# Patient Record
Sex: Male | Born: 1975 | Race: White | Hispanic: No | Marital: Single | Smoking: Former smoker
Health system: Southern US, Community
[De-identification: ages and names within clinical notes are randomized; demographics above are authoritative.]

## PROBLEM LIST (undated history)

## (undated) DIAGNOSIS — B1920 Unspecified viral hepatitis C without hepatic coma: Secondary | ICD-10-CM

## (undated) DIAGNOSIS — F329 Major depressive disorder, single episode, unspecified: Secondary | ICD-10-CM

## (undated) DIAGNOSIS — I509 Heart failure, unspecified: Secondary | ICD-10-CM

## (undated) DIAGNOSIS — F1920 Other psychoactive substance dependence, uncomplicated: Secondary | ICD-10-CM

## (undated) DIAGNOSIS — F32A Depression, unspecified: Secondary | ICD-10-CM

## (undated) DIAGNOSIS — I10 Essential (primary) hypertension: Secondary | ICD-10-CM

## (undated) DIAGNOSIS — E039 Hypothyroidism, unspecified: Secondary | ICD-10-CM

## (undated) DIAGNOSIS — F419 Anxiety disorder, unspecified: Secondary | ICD-10-CM

## (undated) HISTORY — DX: Heart failure, unspecified: I50.9

## (undated) HISTORY — DX: Depression, unspecified: F32.A

## (undated) HISTORY — DX: Other psychoactive substance dependence, uncomplicated: F19.20

## (undated) HISTORY — DX: Unspecified viral hepatitis C without hepatic coma: B19.20

## (undated) HISTORY — PX: OTHER SURGICAL HISTORY: SHX169

## (undated) HISTORY — DX: Essential (primary) hypertension: I10

## (undated) HISTORY — DX: Hypothyroidism, unspecified: E03.9

## (undated) HISTORY — DX: Major depressive disorder, single episode, unspecified: F32.9

## (undated) HISTORY — DX: Anxiety disorder, unspecified: F41.9

---

## 2001-11-20 DIAGNOSIS — B1920 Unspecified viral hepatitis C without hepatic coma: Secondary | ICD-10-CM

## 2001-11-20 HISTORY — DX: Unspecified viral hepatitis C without hepatic coma: B19.20

## 2002-12-12 ENCOUNTER — Encounter: Payer: Self-pay | Admitting: Emergency Medicine

## 2002-12-12 ENCOUNTER — Emergency Department (HOSPITAL_COMMUNITY): Admission: EM | Admit: 2002-12-12 | Discharge: 2002-12-13 | Payer: Self-pay | Admitting: Emergency Medicine

## 2004-02-05 ENCOUNTER — Emergency Department (HOSPITAL_COMMUNITY): Admission: EM | Admit: 2004-02-05 | Discharge: 2004-02-05 | Payer: Self-pay | Admitting: Emergency Medicine

## 2010-03-10 ENCOUNTER — Ambulatory Visit (HOSPITAL_COMMUNITY): Admission: RE | Admit: 2010-03-10 | Discharge: 2010-03-10 | Payer: Self-pay | Admitting: Internal Medicine

## 2010-12-11 ENCOUNTER — Encounter (INDEPENDENT_AMBULATORY_CARE_PROVIDER_SITE_OTHER): Payer: Self-pay | Admitting: Internal Medicine

## 2011-02-07 LAB — CBC
HCT: 48.3 % (ref 39.0–52.0)
Hemoglobin: 16.6 g/dL (ref 13.0–17.0)
MCHC: 34.3 g/dL (ref 30.0–36.0)
MCV: 93.9 fL (ref 78.0–100.0)
Platelets: 247 10*3/uL (ref 150–400)
RBC: 5.14 MIL/uL (ref 4.22–5.81)
RDW: 12.7 % (ref 11.5–15.5)
WBC: 5.9 10*3/uL (ref 4.0–10.5)

## 2011-02-07 LAB — PROTIME-INR
INR: 0.96 (ref 0.00–1.49)
Prothrombin Time: 12.7 seconds (ref 11.6–15.2)

## 2011-02-07 LAB — APTT: aPTT: 33 seconds (ref 24–37)

## 2011-10-06 IMAGING — US US BIOPSY
1 series · 13 of 13 positions shown · non-contrast
Comparison: none

Clinical: Hepatitis C

ULTRASOUND-GUIDED RANDOM CORE LIVER BIOPSY:
An ultrasound guided liver biopsy was thoroughly discussed with the
patient and questions were answered. The benefits, risks,
alternatives, and complications were also discussed. The patient
understands and wishes to proceed with the procedure. A verbal as
well as written consent was obtained.
Ultrasound imaging of the liver was performed and an appropriate
skin entry site was determined. Skin site was marked, prepped and
draped in the usual sterile fashion. 1% Lidocaine was infiltrated
locally. A 17 gauge Trocar needle was advanced under ultrasound
guidance into the liver. Imaging was obtained documenting
appropriate needle position.  Three coaxial 18 gauge core samples
were then obtained and sent to the laboratory for further analysis.
Post procedure scans demonstrate no evidence of bleeding or
hematoma.  The patient tolerated the procedure well with no
immediate complication.
Cardiac and respiratory monitoring was provided by the radiology
RN.  Moderate sedation in the form of 200 mcg fentanyl and 4 mg of
versed were administered throughout the procedure for a total of 20
minutes.

[Series 1: us biopsy · 0.24mm/px · 13 of 13 slices shown]
[im 1/13]
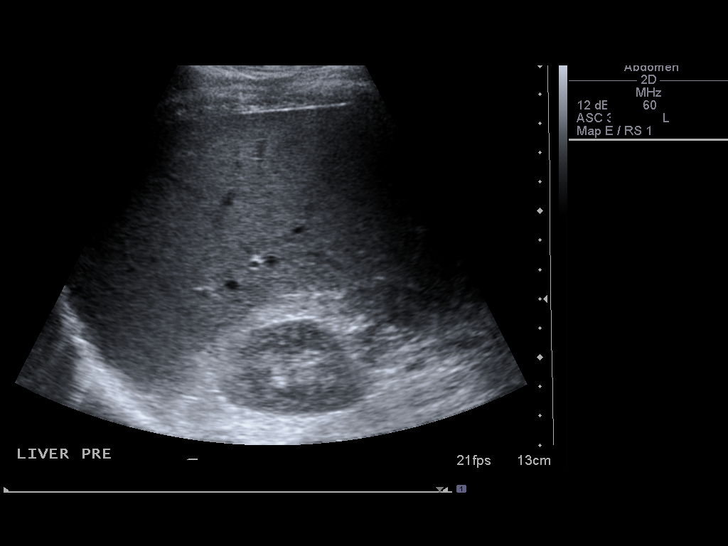
[im 2/13]
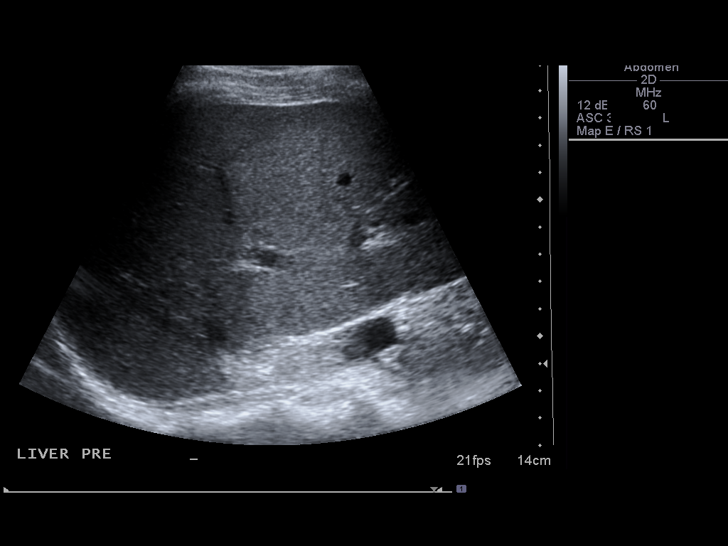
[im 3/13]
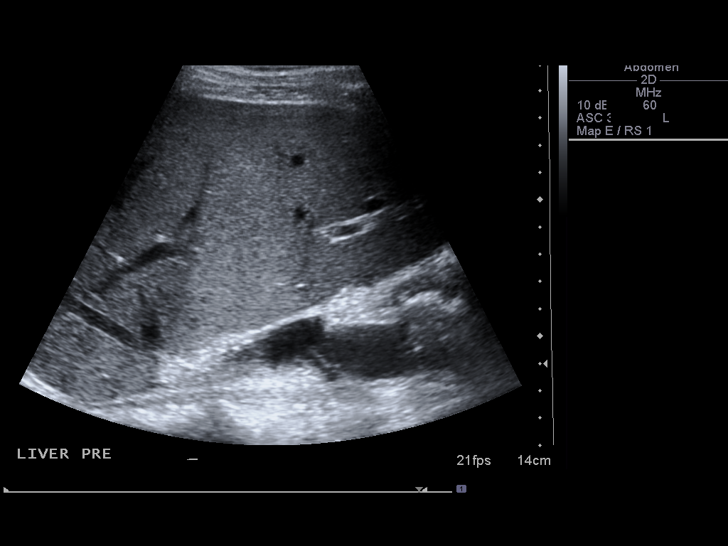
[im 4/13]
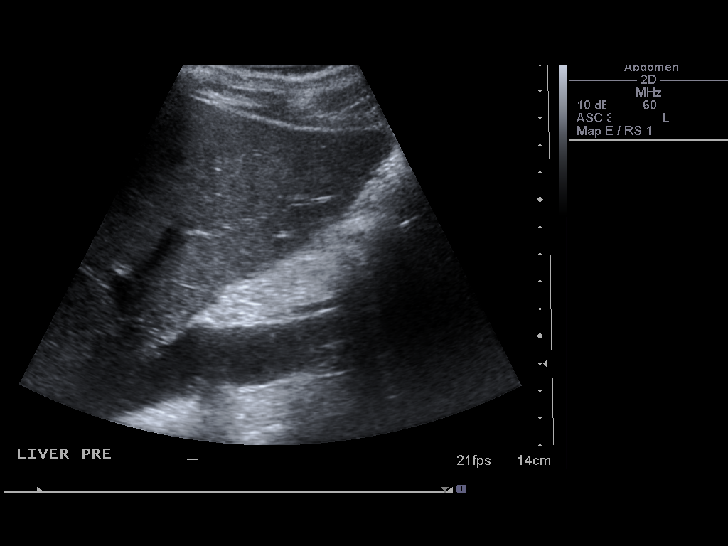
[im 5/13]
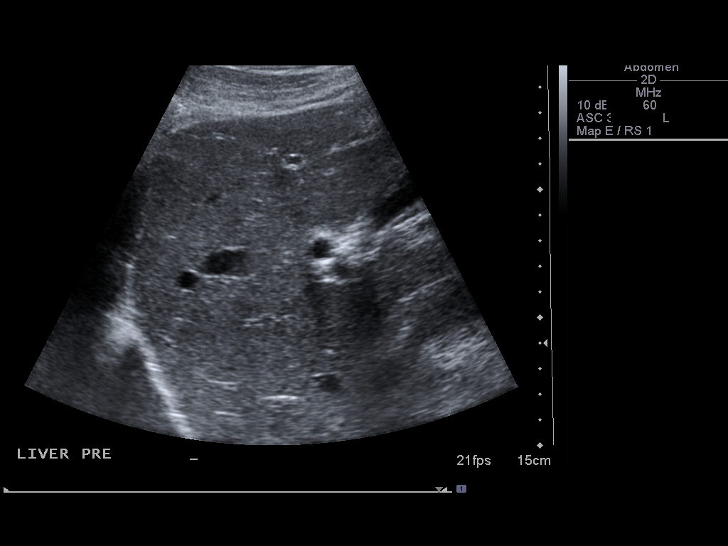
[im 6/13]
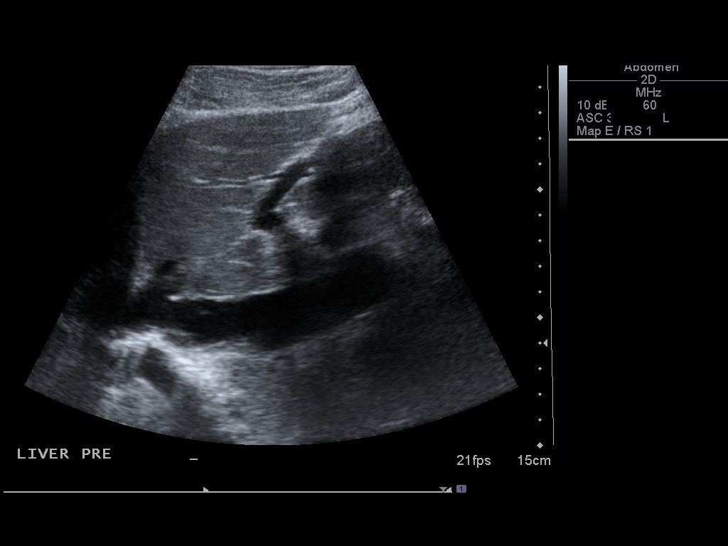
[im 7/13]
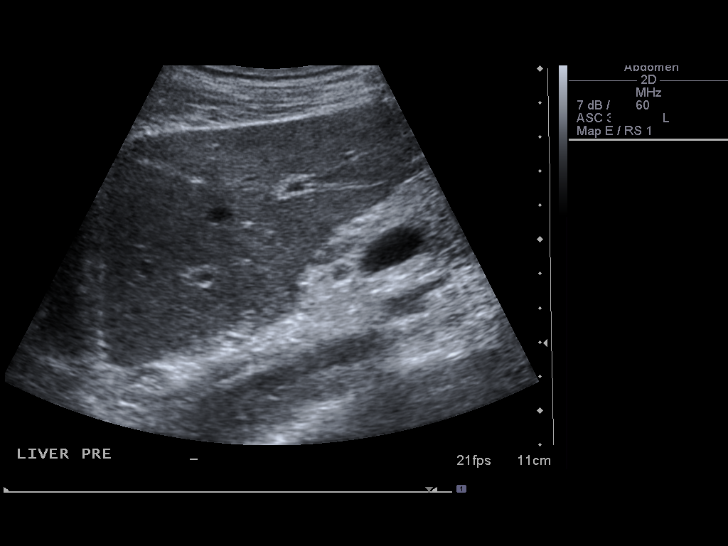
[im 8/13]
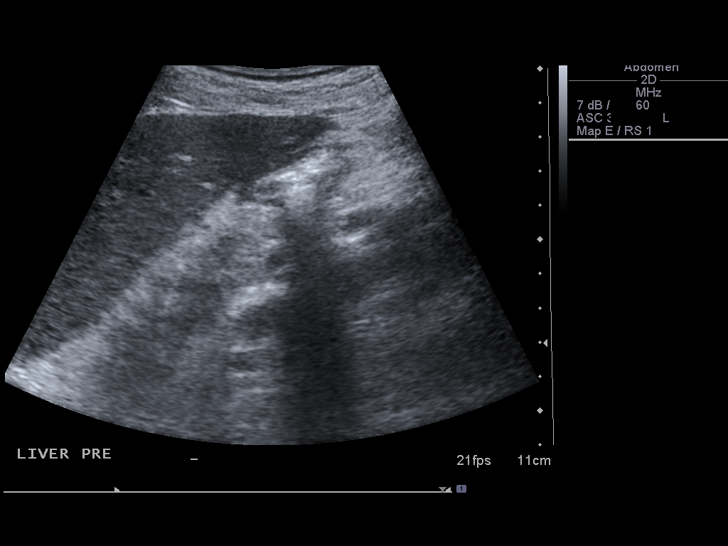
[im 9/13]
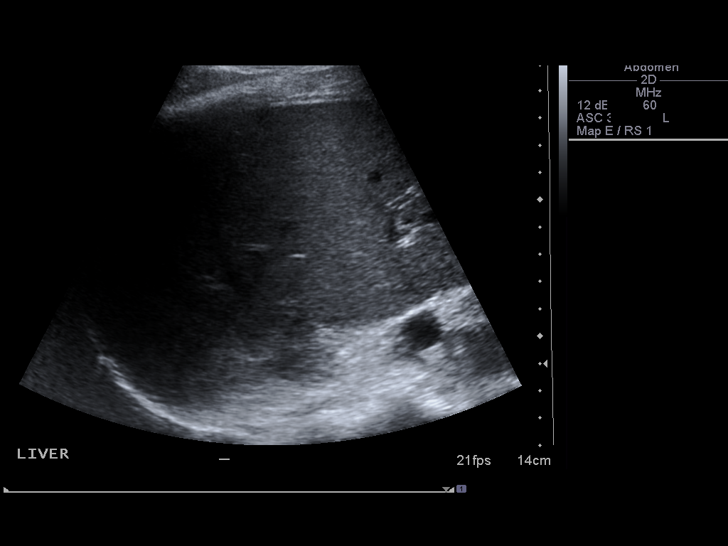
[im 10/13]
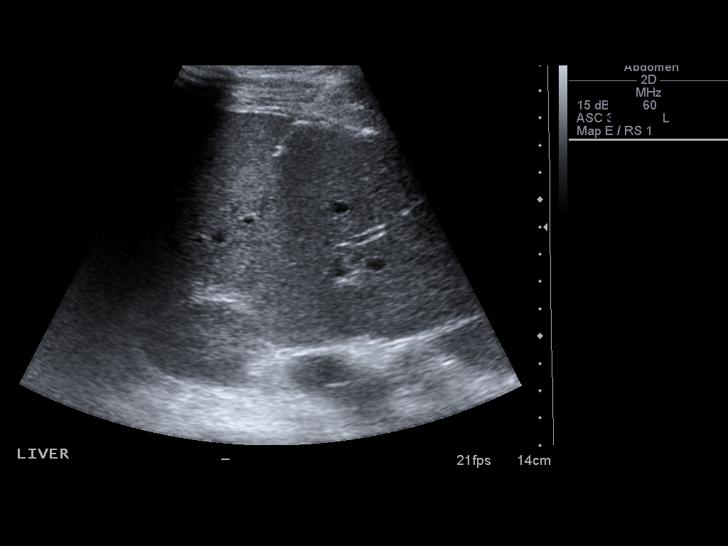
[im 11/13]
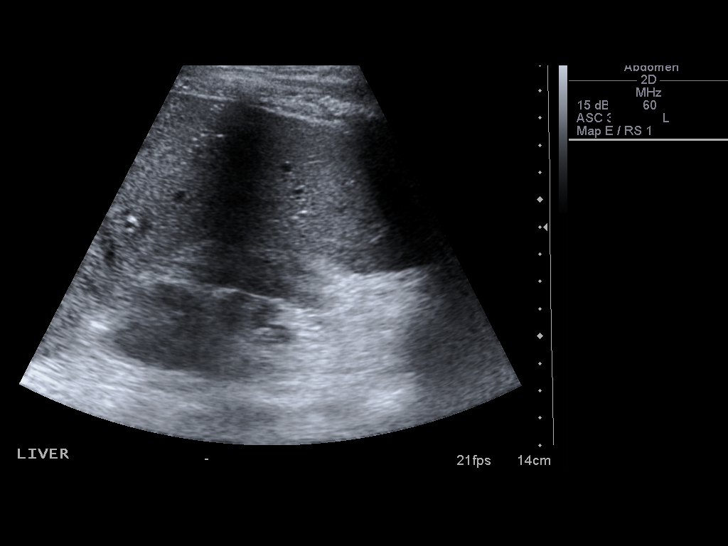
[im 12/13]
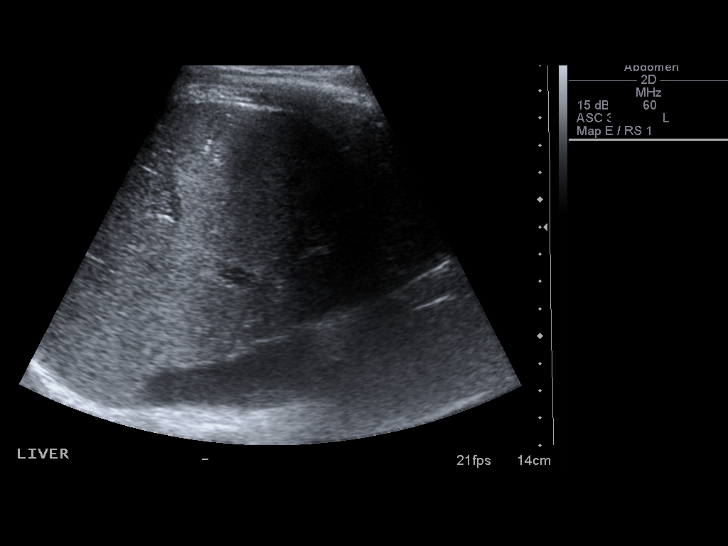
[im 13/13]
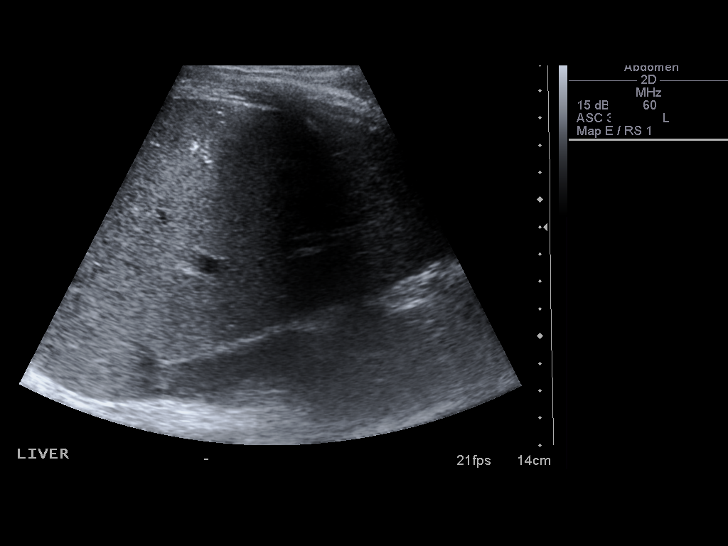

[13 of 13 positions shown; findings below may reference images not displayed]

IMPRESSION: 1. Technically successful ultrasound guided random liver core
biopsy with moderate sedation as described above.

Read by: Seoane, Cervantes.-TIGER

## 2013-12-09 ENCOUNTER — Encounter (INDEPENDENT_AMBULATORY_CARE_PROVIDER_SITE_OTHER): Payer: Self-pay | Admitting: *Deleted

## 2013-12-23 ENCOUNTER — Ambulatory Visit (INDEPENDENT_AMBULATORY_CARE_PROVIDER_SITE_OTHER): Payer: Private Health Insurance - Indemnity | Admitting: Internal Medicine

## 2013-12-23 ENCOUNTER — Encounter (INDEPENDENT_AMBULATORY_CARE_PROVIDER_SITE_OTHER): Payer: Self-pay | Admitting: Internal Medicine

## 2013-12-23 VITALS — BP 112/60 | HR 72 | Temp 98.1°F | Ht 73.0 in | Wt 168.6 lb

## 2013-12-23 DIAGNOSIS — F1920 Other psychoactive substance dependence, uncomplicated: Secondary | ICD-10-CM

## 2013-12-23 DIAGNOSIS — R768 Other specified abnormal immunological findings in serum: Secondary | ICD-10-CM

## 2013-12-23 DIAGNOSIS — R894 Abnormal immunological findings in specimens from other organs, systems and tissues: Secondary | ICD-10-CM

## 2013-12-23 DIAGNOSIS — B1920 Unspecified viral hepatitis C without hepatic coma: Secondary | ICD-10-CM | POA: Insufficient documentation

## 2013-12-23 NOTE — Progress Notes (Addendum)
Subjective:     Patient ID: Donald George, male   DOB: 1976-11-17, 38 y.o.   MRN: [id number]  HPI  Here today for f/u of his Hepatitis C. He was actually seen in the [place] office for this.  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 He was diagnosed with Hepatitis C about 10 yrs ago. He has never received treatment.  He has not done IV drugs for about a year. No etoh in one year ago.  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 He has been in rehab several times. Last stint for rehab was in [place] 1 yr ago.  He tells me this Saturday, he ran out of Soboxone. He snorted Heroin and cocaine this weekend.  When he runs out of this drug, he will do these drugs + Xanax.  Appetite is good. ? Weight loss.  Has a BM daily.  He has received the Hepatitis A and B.   03/10/2010 Liver Biopsy: 1. LIVER, NEEDLE/CORE BIOPSY, RANDOM : CHRONIC ACTIVE HEPATITIS CONSISTENT WITH HEPATITIS C CHARACTERIZED BY, Mild activity, grade ii, No increased fibrosis, stage 0.    Review of Systems Current Outpatient Prescriptions  Medication Sig Dispense Refill  . buprenorphine-naloxone (SUBOXONE) 8-2 MG SUBL SL tablet Place 1 tablet under the tongue. Every 2-3 days      . FLUoxetine (PROZAC) 10 MG capsule Take 10 mg by mouth daily.      . traMADol (ULTRAM) 50 MG tablet Take by mouth every 6 (six) hours as needed.       No current facility-administered medications for this visit.   Past Medical History  Diagnosis Date  . Hepatitis C   . Drug addiction     since age 38 to opiates and etoh. Was an IV drug user.        Objective:   Physical Exam  Filed Vitals:   12/23/13 1504  BP: 112/60  Pulse: 72  Temp: 98.1 F (36.7 C)  Height: 6\' 1"  (1.854 m)  Weight: 168 lb 9.6 oz (76.476 kg)  Alert and oriented. Skin warm and dry. Oral mucosa is moist.   . Sclera anicteric, conjunctivae is pink. Thyroid not enlarged. No cervical lymphadenopathy. Lungs clear. Heart regular rate and rhythm.  Abdomen  is soft. Bowel sounds are positive. No hepatomegaly. No abdominal masses felt. No tenderness.  No edema to lower extremities.       Assessment:   Hepatitis C. Active. Patient is seeking treatment for his Hepatitis C. Presently taking Suboxone which  he gets off the street.  Admits to snorting cocaine and heroin this past weekend. He was having withdrawal symptoms.    Plan:    TSH, Hep C Quaint,   CBC, Cmet, PT/INR, Hepatitis C genotype. Urine Drug screen.  AFP. Patient must be off drugs for 6 months before we treat.  I discussed this case with Dr. Karilyn Cotaehman.

## 2013-12-23 NOTE — Patient Instructions (Signed)
Labs for Hepatitis C. OV in 1 month.

## 2013-12-24 LAB — DRUG SCREEN, URINE
AMPHETAMINE SCRN UR: NEGATIVE
BARBITURATE QUANT UR: NEGATIVE
Benzodiazepines.: NEGATIVE
COCAINE METABOLITES: NEGATIVE
Creatinine,U: 32.4 mg/dL
METHADONE: NEGATIVE
Marijuana Metabolite: NEGATIVE
Opiates: NEGATIVE
Phencyclidine (PCP): NEGATIVE
Propoxyphene: NEGATIVE

## 2013-12-24 LAB — COMPREHENSIVE METABOLIC PANEL
ALBUMIN: 4.8 g/dL (ref 3.5–5.2)
ALT: 67 U/L — AB (ref 0–53)
AST: 51 U/L — ABNORMAL HIGH (ref 0–37)
Alkaline Phosphatase: 58 U/L (ref 39–117)
BUN: 12 mg/dL (ref 6–23)
CALCIUM: 9.4 mg/dL (ref 8.4–10.5)
CHLORIDE: 102 meq/L (ref 96–112)
CO2: 30 meq/L (ref 19–32)
Creat: 0.77 mg/dL (ref 0.50–1.35)
GLUCOSE: 91 mg/dL (ref 70–99)
POTASSIUM: 4.1 meq/L (ref 3.5–5.3)
SODIUM: 139 meq/L (ref 135–145)
TOTAL PROTEIN: 7.4 g/dL (ref 6.0–8.3)
Total Bilirubin: 0.4 mg/dL (ref 0.2–1.2)

## 2013-12-24 LAB — CBC WITH DIFFERENTIAL/PLATELET
Basophils Absolute: 0 10*3/uL (ref 0.0–0.1)
Basophils Relative: 0 % (ref 0–1)
EOS ABS: 0.2 10*3/uL (ref 0.0–0.7)
Eosinophils Relative: 3 % (ref 0–5)
HCT: 44.1 % (ref 39.0–52.0)
HEMOGLOBIN: 15 g/dL (ref 13.0–17.0)
LYMPHS ABS: 1.8 10*3/uL (ref 0.7–4.0)
LYMPHS PCT: 28 % (ref 12–46)
MCH: 29.6 pg (ref 26.0–34.0)
MCHC: 34 g/dL (ref 30.0–36.0)
MCV: 87.2 fL (ref 78.0–100.0)
MONOS PCT: 5 % (ref 3–12)
Monocytes Absolute: 0.3 10*3/uL (ref 0.1–1.0)
NEUTROS ABS: 3.9 10*3/uL (ref 1.7–7.7)
NEUTROS PCT: 64 % (ref 43–77)
Platelets: 261 10*3/uL (ref 150–400)
RBC: 5.06 MIL/uL (ref 4.22–5.81)
RDW: 13.5 % (ref 11.5–15.5)
WBC: 6.2 10*3/uL (ref 4.0–10.5)

## 2013-12-24 LAB — PROTIME-INR
INR: 1 (ref <1.50)
PROTHROMBIN TIME: 13.1 s (ref 11.6–15.2)

## 2013-12-24 LAB — AFP TUMOR MARKER: AFP TUMOR MARKER: 2.8 ng/mL (ref 0.0–8.0)

## 2013-12-24 LAB — TSH: TSH: 5.191 u[IU]/mL — AB (ref 0.350–4.500)

## 2013-12-26 LAB — HEPATITIS C RNA QUANTITATIVE
HCV Quantitative Log: 7.05 {Log} — ABNORMAL HIGH (ref <1.18)
HCV Quantitative: 11124118 IU/mL — ABNORMAL HIGH (ref <15)

## 2013-12-29 ENCOUNTER — Other Ambulatory Visit (HOSPITAL_COMMUNITY): Payer: Private Health Insurance - Indemnity

## 2013-12-29 LAB — HEPATITIS C GENOTYPE

## 2013-12-30 ENCOUNTER — Ambulatory Visit (HOSPITAL_COMMUNITY): Payer: Managed Care, Other (non HMO)

## 2014-01-16 ENCOUNTER — Ambulatory Visit (HOSPITAL_COMMUNITY)
Admission: RE | Admit: 2014-01-16 | Discharge: 2014-01-16 | Disposition: A | Discharge status: Home / Self Care | Payer: Managed Care, Other (non HMO) | Source: Ambulatory Visit | Attending: Internal Medicine | Admitting: Internal Medicine

## 2014-01-16 DIAGNOSIS — B1920 Unspecified viral hepatitis C without hepatic coma: Secondary | ICD-10-CM | POA: Insufficient documentation

## 2014-01-16 DIAGNOSIS — R768 Other specified abnormal immunological findings in serum: Secondary | ICD-10-CM

## 2014-01-20 NOTE — Progress Notes (Signed)
Apt has been scheduled for 07/24/14 with Dorene Arerri Setzer, NP.

## 2014-01-23 ENCOUNTER — Ambulatory Visit (INDEPENDENT_AMBULATORY_CARE_PROVIDER_SITE_OTHER): Payer: Managed Care, Other (non HMO) | Admitting: Internal Medicine

## 2014-07-24 ENCOUNTER — Encounter (INDEPENDENT_AMBULATORY_CARE_PROVIDER_SITE_OTHER): Payer: Self-pay | Admitting: *Deleted

## 2014-07-24 ENCOUNTER — Telehealth (INDEPENDENT_AMBULATORY_CARE_PROVIDER_SITE_OTHER): Payer: Self-pay | Admitting: *Deleted

## 2014-07-24 ENCOUNTER — Ambulatory Visit (INDEPENDENT_AMBULATORY_CARE_PROVIDER_SITE_OTHER): Payer: Managed Care, Other (non HMO) | Admitting: Internal Medicine

## 2014-07-24 NOTE — Telephone Encounter (Signed)
Bradey No Showed for his apt with Dorene Ar, NP on 07/24/14. A NS letter was mail.

## 2014-08-28 ENCOUNTER — Ambulatory Visit (INDEPENDENT_AMBULATORY_CARE_PROVIDER_SITE_OTHER): Payer: Managed Care, Other (non HMO) | Admitting: Internal Medicine

## 2014-10-30 ENCOUNTER — Encounter (INDEPENDENT_AMBULATORY_CARE_PROVIDER_SITE_OTHER): Payer: Self-pay | Admitting: *Deleted

## 2014-10-30 ENCOUNTER — Ambulatory Visit (INDEPENDENT_AMBULATORY_CARE_PROVIDER_SITE_OTHER): Payer: Managed Care, Other (non HMO) | Admitting: Internal Medicine

## 2014-10-30 ENCOUNTER — Encounter (INDEPENDENT_AMBULATORY_CARE_PROVIDER_SITE_OTHER): Payer: Self-pay | Admitting: Internal Medicine

## 2014-10-30 VITALS — BP 114/72 | HR 80 | Temp 97.9°F | Ht 73.0 in | Wt 173.0 lb

## 2014-10-30 DIAGNOSIS — B1920 Unspecified viral hepatitis C without hepatic coma: Secondary | ICD-10-CM

## 2014-10-30 LAB — HEPATIC FUNCTION PANEL
ALT: 107 U/L — AB (ref 0–53)
AST: 93 U/L — AB (ref 0–37)
Albumin: 4.8 g/dL (ref 3.5–5.2)
Alkaline Phosphatase: 60 U/L (ref 39–117)
BILIRUBIN INDIRECT: 0.3 mg/dL (ref 0.2–1.2)
Bilirubin, Direct: 0.2 mg/dL (ref 0.0–0.3)
TOTAL PROTEIN: 7.3 g/dL (ref 6.0–8.3)
Total Bilirubin: 0.5 mg/dL (ref 0.2–1.2)

## 2014-10-30 LAB — CBC WITH DIFFERENTIAL/PLATELET
BASOS PCT: 1 % (ref 0–1)
Basophils Absolute: 0.1 10*3/uL (ref 0.0–0.1)
EOS PCT: 4 % (ref 0–5)
Eosinophils Absolute: 0.3 10*3/uL (ref 0.0–0.7)
HEMATOCRIT: 45.5 % (ref 39.0–52.0)
Hemoglobin: 15.6 g/dL (ref 13.0–17.0)
Lymphocytes Relative: 33 % (ref 12–46)
Lymphs Abs: 2.4 10*3/uL (ref 0.7–4.0)
MCH: 29.2 pg (ref 26.0–34.0)
MCHC: 34.3 g/dL (ref 30.0–36.0)
MCV: 85.2 fL (ref 78.0–100.0)
MONO ABS: 0.4 10*3/uL (ref 0.1–1.0)
MONOS PCT: 5 % (ref 3–12)
MPV: 9.9 fL (ref 9.4–12.4)
NEUTROS ABS: 4.1 10*3/uL (ref 1.7–7.7)
Neutrophils Relative %: 57 % (ref 43–77)
Platelets: 279 10*3/uL (ref 150–400)
RBC: 5.34 MIL/uL (ref 4.22–5.81)
RDW: 13.3 % (ref 11.5–15.5)
WBC: 7.2 10*3/uL (ref 4.0–10.5)

## 2014-10-30 NOTE — Patient Instructions (Signed)
Labs. OV pending. 

## 2014-10-30 NOTE — Progress Notes (Signed)
   Subjective:    Patient ID: Donald George, male    DOB: 10/25/1976, 38 y.o.   MRN: [id number]  HPI Here today for f/u of his Hepatitis C. Last seen in February of this year. Hx of IV drug use. Diagnosed with Hepatitis C about 10 yrs ago by Dr. Margo Commonapper.  He has never received tx for Hep C. Hx of opiate and cocaine abuse and etoh. Has been in Rehab several times. Has been followed by [facility] (Dr Beryle BeamsKofi [place]onquah for his addiction. In November is tested positive for cocaine per Dr. Gerilyn Pilgrimoonquah notes.  Genotype 1B. 12/2013 HCV quainted elevated. AFP 2.8. Followed by Dr. Gerilyn Pilgrimoonquah for his drug addiction. Maintained on Suboxone daily. Has been seeing Dr. Gerilyn Pilgrimoonquah for over 6 months. His drug screen have all been negative except for the one in November. He tells me he is doing well. He has not had any weight loss. BMs are normal. No melena or BRRB.  He is not doing street drugs at this time. He has received Hepatitis A and B shots.  03/10/2010 Liver Biopsy: 1. LIVER, NEEDLE/CORE BIOPSY, RANDOM : CHRONIC ACTIVE HEPATITIS CONSISTENT WITH HEPATITIS C CHARACTERIZED BY, Mild activity, grade ii, No increased fibrosis, stage 0.      Patient ID: Donald George, male DOB: 10/25/1976, 38 y.o. MRN: [id number]     Review of Systems Single, one son age 534 in good health. 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  Past Medical History  Diagnosis Date  . Hepatitis C   . Drug addiction     since age 38 to opiates and etoh. Was an IV drug user.    Past Surgical History  Procedure Laterality Date  . Removal of cyst from throat      as a child ([age]yrs of age)    No Known Allergies  Current Outpatient Prescriptions on File Prior to Visit  Medication Sig Dispense Refill  . buprenorphine-naloxone (SUBOXONE) 8-2 MG SUBL SL tablet Place under the tongue. 12mg  daily    . FLUoxetine (PROZAC) 10 MG capsule Take 40 mg by mouth daily.     . traMADol (ULTRAM) 50 MG tablet Take by mouth every 6 (six) hours as needed.      No current facility-administered medications on file prior to visit.        Objective:   Physical Exam Filed Vitals:   10/30/14 0914  Height: 6\' 1"  (1.854 m)  Weight: 173 lb (78.472 kg)   Alert and oriented. Skin warm and dry. Oral mucosa is moist.   . Sclera anicteric, conjunctivae is pink. Thyroid not enlarged. No cervical lymphadenopathy. Lungs clear. Heart regular rate and rhythm.  Abdomen is soft. Bowel sounds are positive. No hepatomegaly. No abdominal masses felt. No tenderness.  No edema to lower extremities.        Assessment & Plan:  Hepatitis C. Genotype 1B.  Hep C Quaint.  Needs US quaint Hepatic function.  OV pending.  I discuss with Dr. Karilyn Cotaehman. Patient had positive drug screen last month (cocaine). Patient also admits that he did cocaine. I told him I wanted to treat him but he has to have a negative drug screen. We will continue to follow up. I did advised him [facility] or [place] for his Hepatitis C.

## 2014-10-31 LAB — AFP TUMOR MARKER: AFP-Tumor Marker: 2.8 ng/mL (ref <6.1)

## 2014-11-02 LAB — HEPATITIS C RNA QUANTITATIVE
HCV Quantitative Log: 6.41 {Log} — ABNORMAL HIGH (ref <1.18)
HCV Quantitative: 2570467 IU/mL — ABNORMAL HIGH (ref <15)

## 2014-11-03 ENCOUNTER — Other Ambulatory Visit (HOSPITAL_COMMUNITY): Payer: Managed Care, Other (non HMO)

## 2014-11-09 ENCOUNTER — Ambulatory Visit (HOSPITAL_COMMUNITY)
Admission: RE | Admit: 2014-11-09 | Discharge: 2014-11-09 | Disposition: A | Discharge status: Home / Self Care | Payer: Managed Care, Other (non HMO) | Source: Ambulatory Visit | Attending: Internal Medicine | Admitting: Internal Medicine

## 2014-11-09 ENCOUNTER — Encounter (INDEPENDENT_AMBULATORY_CARE_PROVIDER_SITE_OTHER): Payer: Self-pay | Admitting: Internal Medicine

## 2014-11-09 DIAGNOSIS — B1920 Unspecified viral hepatitis C without hepatic coma: Secondary | ICD-10-CM | POA: Insufficient documentation

## 2014-11-16 ENCOUNTER — Telehealth (INDEPENDENT_AMBULATORY_CARE_PROVIDER_SITE_OTHER): Payer: Self-pay | Admitting: *Deleted

## 2014-11-16 DIAGNOSIS — B182 Chronic viral hepatitis C: Secondary | ICD-10-CM

## 2014-11-16 NOTE — Telephone Encounter (Signed)
.  Per Terri Setzer,NP patient to have labs drawn in 6 months. 

## 2014-11-17 ENCOUNTER — Encounter (INDEPENDENT_AMBULATORY_CARE_PROVIDER_SITE_OTHER): Payer: Self-pay | Admitting: *Deleted

## 2015-04-14 ENCOUNTER — Encounter (INDEPENDENT_AMBULATORY_CARE_PROVIDER_SITE_OTHER): Payer: Self-pay | Admitting: *Deleted

## 2015-04-14 ENCOUNTER — Other Ambulatory Visit (INDEPENDENT_AMBULATORY_CARE_PROVIDER_SITE_OTHER): Payer: Self-pay | Admitting: *Deleted

## 2015-04-14 DIAGNOSIS — B182 Chronic viral hepatitis C: Secondary | ICD-10-CM

## 2015-05-04 LAB — CBC WITH DIFFERENTIAL/PLATELET
BASOS ABS: 0 10*3/uL (ref 0.0–0.1)
Basophils Relative: 1 % (ref 0–1)
EOS ABS: 0.2 10*3/uL (ref 0.0–0.7)
EOS PCT: 4 % (ref 0–5)
HCT: 42.9 % (ref 39.0–52.0)
Hemoglobin: 14.7 g/dL (ref 13.0–17.0)
LYMPHS PCT: 38 % (ref 12–46)
Lymphs Abs: 1.9 10*3/uL (ref 0.7–4.0)
MCH: 29.5 pg (ref 26.0–34.0)
MCHC: 34.3 g/dL (ref 30.0–36.0)
MCV: 86.1 fL (ref 78.0–100.0)
MONO ABS: 0.3 10*3/uL (ref 0.1–1.0)
MPV: 10.6 fL (ref 8.6–12.4)
Monocytes Relative: 7 % (ref 3–12)
Neutro Abs: 2.5 10*3/uL (ref 1.7–7.7)
Neutrophils Relative %: 50 % (ref 43–77)
Platelets: 246 10*3/uL (ref 150–400)
RBC: 4.98 MIL/uL (ref 4.22–5.81)
RDW: 13 % (ref 11.5–15.5)
WBC: 4.9 10*3/uL (ref 4.0–10.5)

## 2015-05-04 LAB — HEPATIC FUNCTION PANEL
ALK PHOS: 54 U/L (ref 39–117)
ALT: 74 U/L — AB (ref 0–53)
AST: 69 U/L — ABNORMAL HIGH (ref 0–37)
Albumin: 4.3 g/dL (ref 3.5–5.2)
Bilirubin, Direct: 0.1 mg/dL (ref 0.0–0.3)
Indirect Bilirubin: 0.3 mg/dL (ref 0.2–1.2)
Total Bilirubin: 0.4 mg/dL (ref 0.2–1.2)
Total Protein: 7.3 g/dL (ref 6.0–8.3)

## 2015-05-04 LAB — HEPATITIS C RNA QUANTITATIVE
HCV QUANT: 2796935 [IU]/mL — AB (ref <15)
HCV Quantitative Log: 6.45 {Log} — ABNORMAL HIGH (ref <1.18)

## 2015-05-04 LAB — AFP TUMOR MARKER: AFP-Tumor Marker: 2.7 ng/mL (ref <6.1)

## 2015-05-13 ENCOUNTER — Encounter (INDEPENDENT_AMBULATORY_CARE_PROVIDER_SITE_OTHER): Payer: Self-pay | Admitting: Internal Medicine

## 2015-05-13 ENCOUNTER — Other Ambulatory Visit (INDEPENDENT_AMBULATORY_CARE_PROVIDER_SITE_OTHER): Payer: Self-pay | Admitting: Internal Medicine

## 2015-05-13 ENCOUNTER — Ambulatory Visit (INDEPENDENT_AMBULATORY_CARE_PROVIDER_SITE_OTHER): Payer: Managed Care, Other (non HMO) | Admitting: Internal Medicine

## 2015-05-13 VITALS — BP 118/70 | HR 72 | Temp 98.0°F | Ht 73.0 in | Wt 168.6 lb

## 2015-05-13 DIAGNOSIS — B1920 Unspecified viral hepatitis C without hepatic coma: Secondary | ICD-10-CM | POA: Diagnosis not present

## 2015-05-13 NOTE — Patient Instructions (Signed)
Urine drug screen

## 2015-05-13 NOTE — Progress Notes (Signed)
   Subjective:    Patient ID: Donald George, male    DOB: January 16, 1976, 39 y.o.   MRN: [id number]  HPI Here today for f/u of his Hepatitis C. Last seen in December of 2015. Six month follow. Diagnosed with Hep C about 10 yrs ago by Dr. Gala Lewandowsky. Never been treated for Hep C. Hx of opiate and cocaine abuse, and etoh abuse. Hx of IV drug abuse.  Has been in Rehab severaly times.  Has appt  Today with Dr. Gerilyn Pilgrim for his drug addiction. Maintained on Suboxone daily. He has not done any drugs since November.   In November of 2015 he tested positive for drugs. Appetite is good. No weight loss. Usually has a BM daily.  He is genotype 1B.     He has received Hepatitis A and B shots.  03/10/2010 Liver Biopsy: 1. LIVER, NEEDLE/CORE BIOPSY, RANDOM : CHRONIC ACTIVE HEPATITIS CONSISTENT WITH HEPATITIS C CHARACTERIZED BY, Mild activity, grade ii, No increased fibrosis, stage 0.  05/03/2015 AFP 2.7  CBC    Component Value Date/Time   WBC 4.9 05/03/2015 1035   RBC 4.98 05/03/2015 1035   HGB 14.7 05/03/2015 1035   HCT 42.9 05/03/2015 1035   PLT 246 05/03/2015 1035   MCV 86.1 05/03/2015 1035   MCH 29.5 05/03/2015 1035   MCHC 34.3 05/03/2015 1035   RDW 13.0 05/03/2015 1035   LYMPHSABS 1.9 05/03/2015 1035   MONOABS 0.3 05/03/2015 1035   EOSABS 0.2 05/03/2015 1035   BASOSABS 0.0 05/03/2015 1035   Hepatic Function Panel     Component Value Date/Time   PROT 7.3 05/03/2015 1040   ALBUMIN 4.3 05/03/2015 1040   AST 69* 05/03/2015 1040   ALT 74* 05/03/2015 1040   ALKPHOS 54 05/03/2015 1040   BILITOT 0.4 05/03/2015 1040   BILIDIR 0.1 05/03/2015 1040   IBILI 0.3 05/03/2015 1040      Review of Systems   Past Medical History  Diagnosis Date  . Hepatitis C   . Drug addiction     since age 16 to opiates and etoh. Was an IV drug user.    Past Surgical History  Procedure Laterality Date  . Removal of cyst from throat      as a child (20yrs of age)    No Known Allergies  Current Outpatient  Prescriptions on File Prior to Visit  Medication Sig Dispense Refill  . buprenorphine-naloxone (SUBOXONE) 8-2 MG SUBL SL tablet Place under the tongue. 12mg  daily    . FLUoxetine (PROZAC) 10 MG capsule Take 40 mg by mouth daily.      No current facility-administered medications on file prior to visit.           Objective:   Physical Exam Blood pressure 118/70, pulse 72, temperature 98 F (36.7 C), height 6\' 1"  (1.854 m), weight 168 lb 9.6 oz (76.476 kg).  Alert and oriented. Skin warm and dry. Oral mucosa is moist.   . Sclera anicteric, conjunctivae is pink. Thyroid not enlarged. No cervical lymphadenopathy. Lungs clear. Heart regular rate and rhythm.  Abdomen is soft. Bowel sounds are positive. No hepatomegaly. No abdominal masses felt. No tenderness.  No edema to lower extremities.         Assessment & Plan:  Hepatitis C. No drugs since November of 2015. We will treat if his urine drug screen is negative. Will get drug screens for Dr. Gerilyn Pilgrim. Urine drug screen. Will treat.

## 2015-05-14 LAB — DRUG SCREEN, URINE
Amphetamine Screen, Ur: NEGATIVE
BARBITURATE QUANT UR: NEGATIVE
BENZODIAZEPINES.: NEGATIVE
Cocaine Metabolites: NEGATIVE
Creatinine,U: 29.92 mg/dL
METHADONE: NEGATIVE
Marijuana Metabolite: NEGATIVE
Opiates: NEGATIVE
PROPOXYPHENE: NEGATIVE
Phencyclidine (PCP): NEGATIVE

## 2015-05-17 ENCOUNTER — Telehealth (INDEPENDENT_AMBULATORY_CARE_PROVIDER_SITE_OTHER): Payer: Self-pay | Admitting: Internal Medicine

## 2015-05-17 NOTE — Telephone Encounter (Signed)
This will be addressed on 05-19-15.

## 2015-05-17 NOTE — Telephone Encounter (Signed)
Donald George, will treat with Harvoni x 12 weeks or the [employer] x 12 weeks.

## 2015-05-18 NOTE — Telephone Encounter (Signed)
Paper work completed for [employer]. Once we have heard from [employer] , patient will be contacted.

## 2015-06-01 ENCOUNTER — Encounter (INDEPENDENT_AMBULATORY_CARE_PROVIDER_SITE_OTHER): Payer: Self-pay

## 2015-06-30 ENCOUNTER — Telehealth (INDEPENDENT_AMBULATORY_CARE_PROVIDER_SITE_OTHER): Payer: Self-pay | Admitting: *Deleted

## 2015-06-30 NOTE — Telephone Encounter (Signed)
Message left on cell phone.   

## 2015-06-30 NOTE — Telephone Encounter (Signed)
On June 30 , 2016 we rec'd information from [employer] that the inforamtion for PA had been sent to [facility]  On 05/27/15 i followed up and was told by J.R. With [employer], that this application had been sentover to [place] as they were the third party administrator,and to call [phone]. Today,08/100/16, i called and spoke with Angie. She states that the patient has a limited plan, and no specialty medications are covered on this plan. Therefore, they will not cover the Harvoni.  This information has been forwarded to Terri for review.

## 2015-07-01 ENCOUNTER — Telehealth (INDEPENDENT_AMBULATORY_CARE_PROVIDER_SITE_OTHER): Payer: Self-pay | Admitting: *Deleted

## 2015-07-01 NOTE — Telephone Encounter (Signed)
Patient had returned Donald Ar, NP-C call.  Terri asked that he be told his insurance will not pay for his treatment  He already felt they wouldn't  pay  He is working a temp job and hopefully will be hired and have better insurance coverage.  He will bring [place] this once he obtains this coverage.

## 2015-07-01 NOTE — Telephone Encounter (Signed)
noted 

## 2016-06-06 IMAGING — US US ABDOMEN COMPLETE
1 series · 14 of 25 positions shown · non-contrast
Comparison: None

CLINICAL DATA: Hepatitis-C infection for 10 years without hepatic
coma

EXAM:
ULTRASOUND ABDOMEN COMPLETE

[Series 1: us abdomen complete · 0.18mm/px · 14 of 122 slices shown]
[im 1/122]
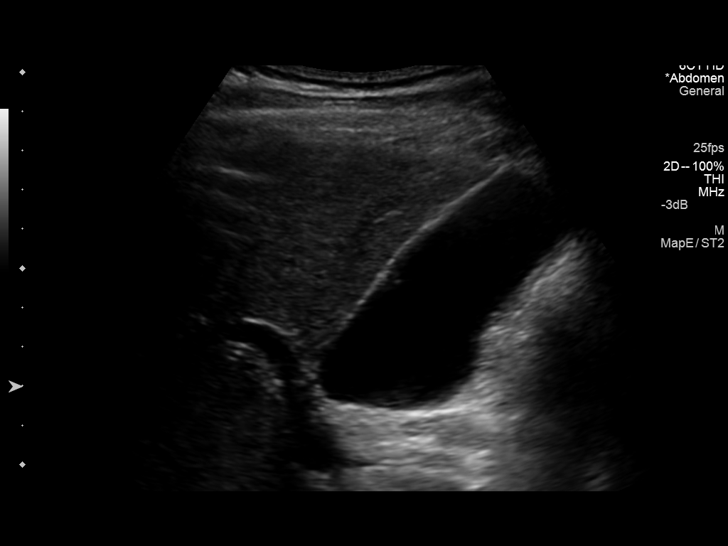
[im 11/122]
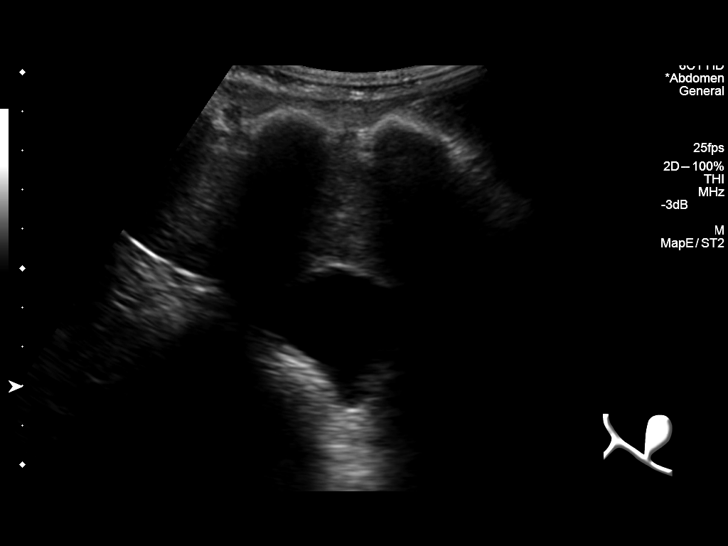
[im 21/122]
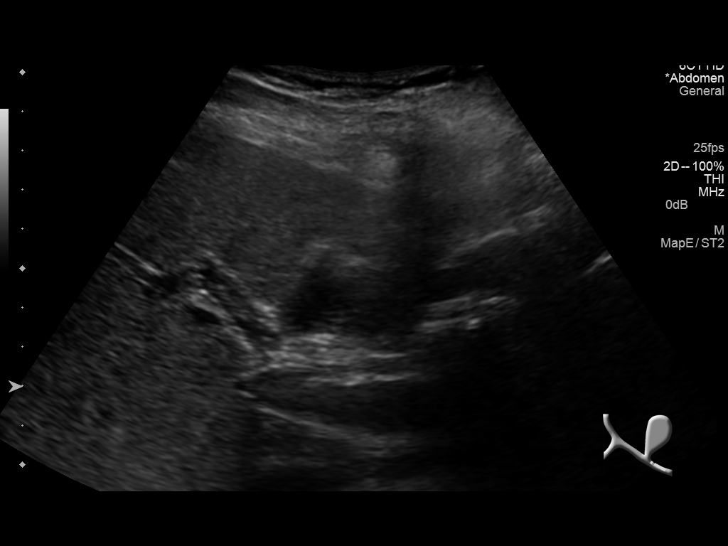
[im 31/122]
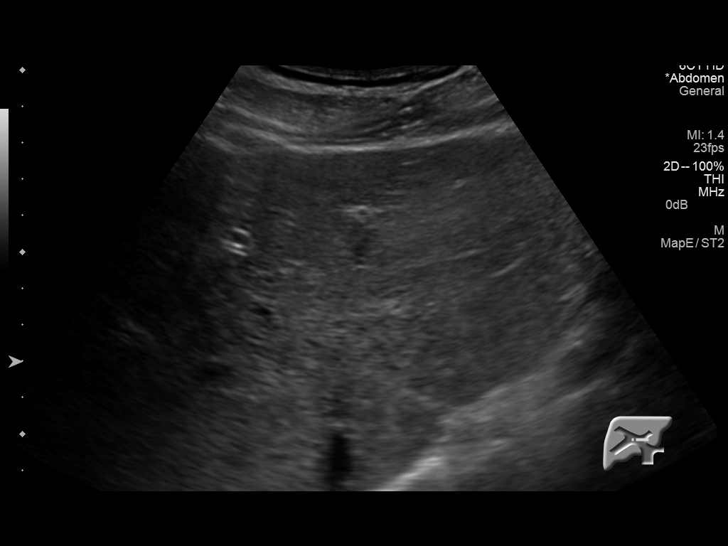
[im 41/122]
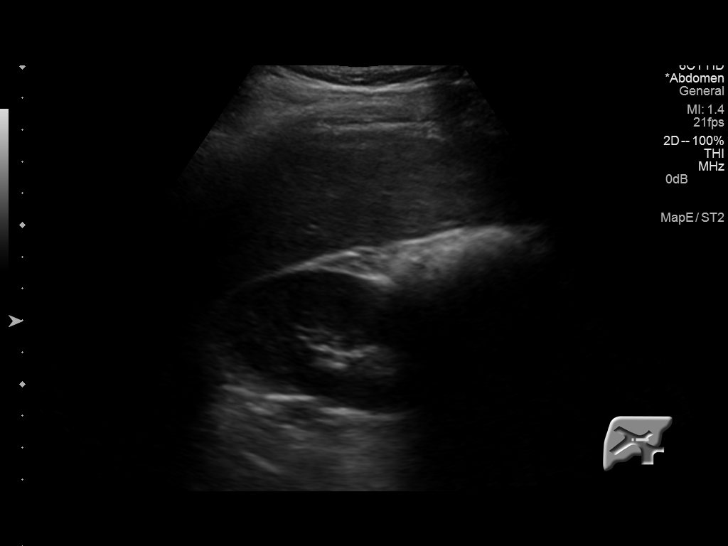
[im 46/122]
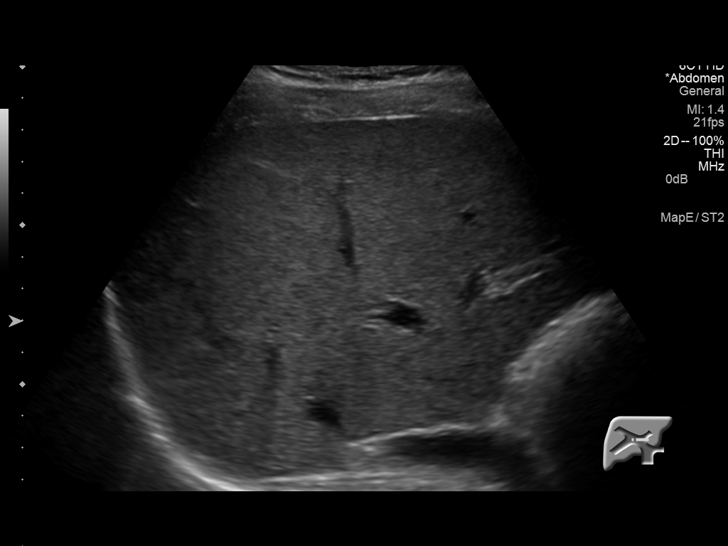
[im 56/122]
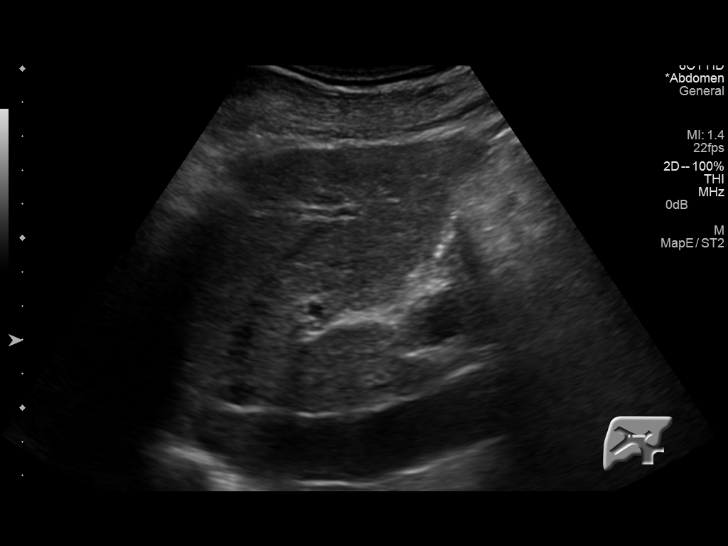
[im 66/122]
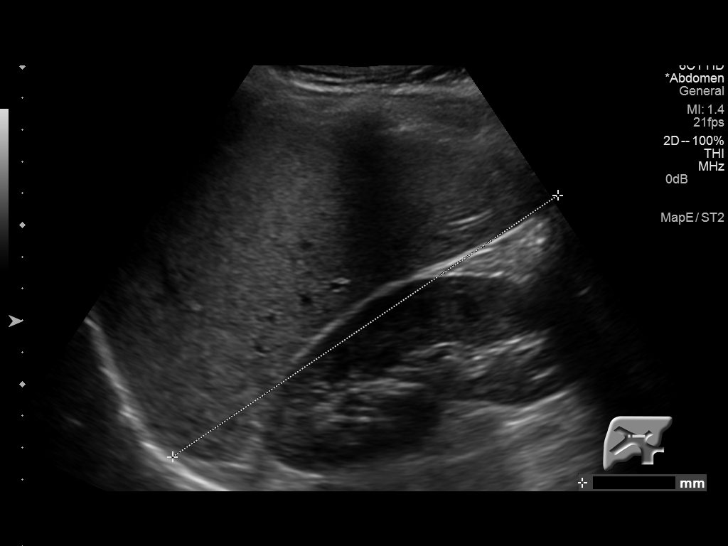
[im 76/122]
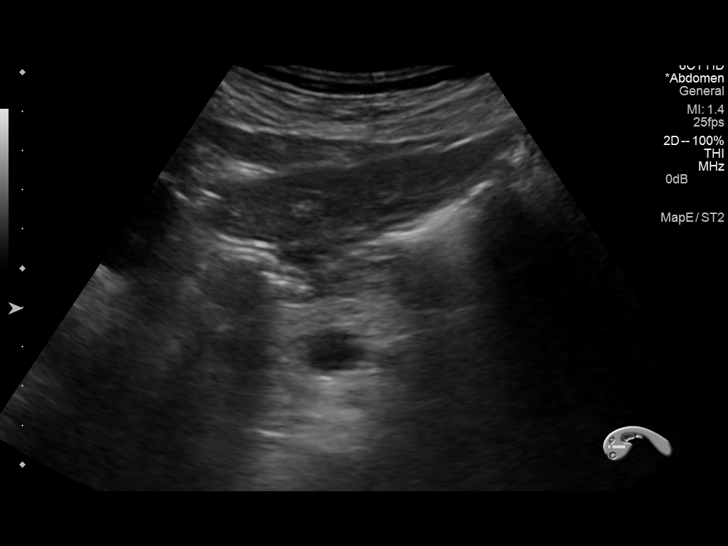
[im 81/122]
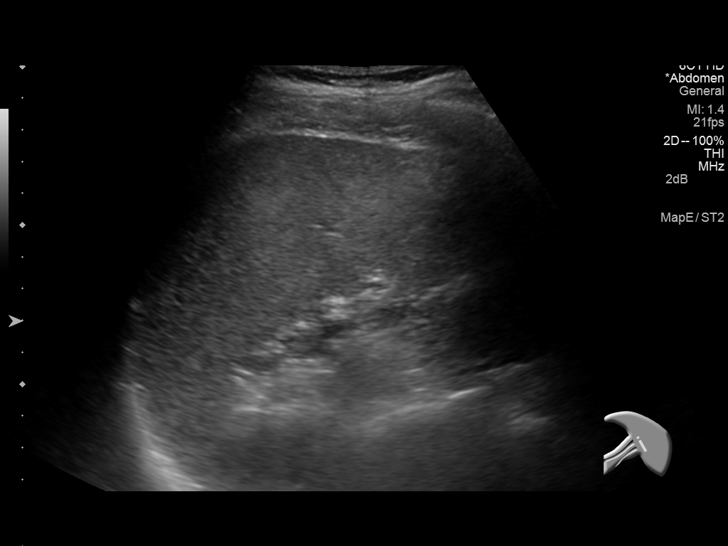
[im 91/122]
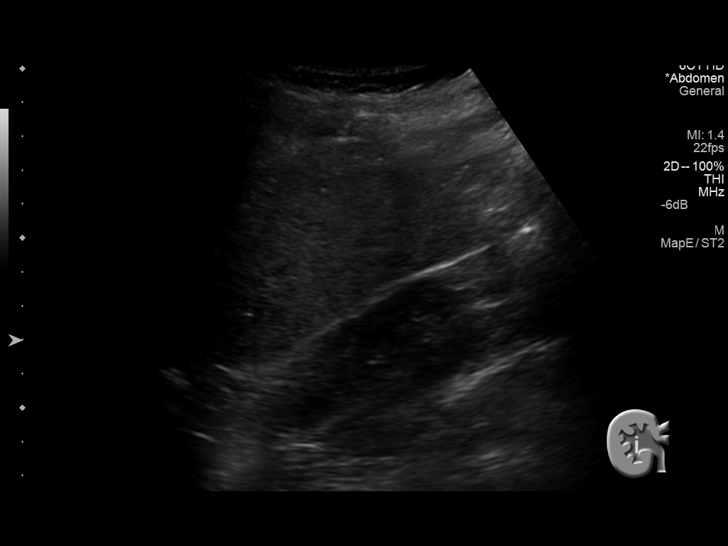
[im 101/122]
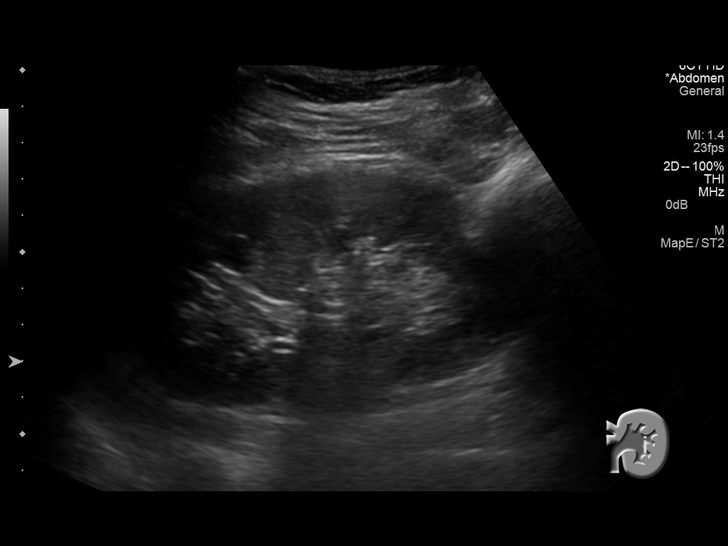
[im 111/122]
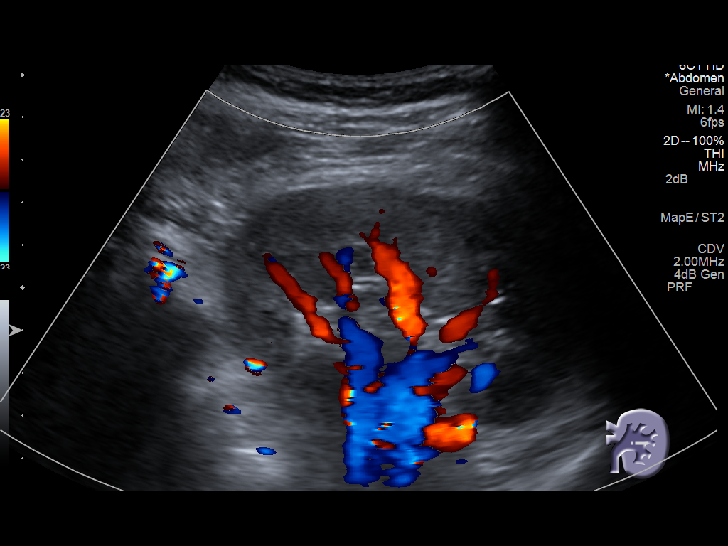
[im 122/122]
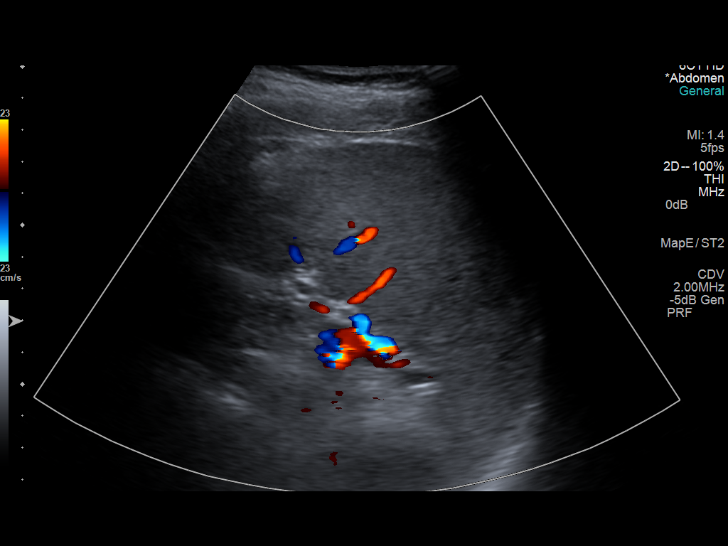

[14 of 25 positions shown; findings below may reference images not displayed]

FINDINGS: Gallbladder: Normally distended without stones or wall thickening.

No pericholecystic fluid or sonographic Murphy sign.

Common bile duct: Diameter: Normal caliber 3 mm diameter

Liver: Slightly increased hepatic echogenicity which can be seen
with fatty infiltration and cirrhosis as well as certain
infiltrative disorders. Hepatic margins however appear smooth
without irregularity or nodularity. Hepatopetal portal venous flow.

IVC: Normal appearance

Pancreas: Normal appearance

Spleen: Normal appearance, 11.2 cm length

Right Kidney: Length: 12.3 cm. Normal morphology without mass or
hydronephrosis.

Left Kidney: Length: 11.6 cm. Normal morphology without mass or
hydronephrosis.

Abdominal aorta: Normal caliber

Other findings: No free-fluid
IMPRESSION: Probable mild fatty infiltration of the liver as above.

Otherwise negative exam.

## 2016-11-20 DIAGNOSIS — K7460 Unspecified cirrhosis of liver: Secondary | ICD-10-CM

## 2016-11-20 HISTORY — DX: Unspecified cirrhosis of liver: K74.60

## 2022-10-09 NOTE — Congregational Nurse Program (Signed)
  Pt attended [facility] on today  to complete enrollment into Connect program with Gevena Mart.  Pt was by a provider during an hospital admission at [place] hospital during week of Aug 17-25, 2023 he described as a "cardiac event",  He states he was recently released from alcohol and drug treatment facility during week of 11.13.23.  Chief Reasons Needed for  PCP Needing to complete a f/u visit per her a hospital/rehabilitation discharge and to receive disease and medication management/refills.    States his meds are getting low. (Metoprolol, lisinopril, levothyroxine).   Pt states he is aware of getting connecting with [facility] and did contact on today to receive information on intake process and appointment/walk-in hours after leaving the careconnect enrollment appt on today (11.20.23)  States he ahs   Past /Current Medical History Hx of hypertension,thyroid disease, substance abuse    Socio-determinants health needs identified: -On-going Medication Education officer, community needed -Food resources ([employer] [employer] market, Orthoptist with additional food resources) -Research scientist (physical sciences) Health Resources- [facility]     Plan -  Enrollment completed Care Connect Program.  - Care Connect Eligibile (11.20.23 thru 11.20.24) -Referral sent to RN Nurse Case Manager, Norval Gable, for initial and continous medical case management upon completion of first medical appointment   -Scheduled Appointment for first medical visit was scheduled for Tues, October 10, 2022 @  11am at the [facility] [place].

## 2022-10-10 ENCOUNTER — Encounter: Payer: Self-pay | Admitting: Physician Assistant

## 2022-10-10 ENCOUNTER — Ambulatory Visit: Payer: Self-pay | Admitting: Physician Assistant

## 2022-10-10 VITALS — BP 100/70 | HR 100 | Temp 99.1°F | Ht 74.0 in | Wt 171.2 lb

## 2022-10-10 DIAGNOSIS — Z8719 Personal history of other diseases of the digestive system: Secondary | ICD-10-CM

## 2022-10-10 DIAGNOSIS — F489 Nonpsychotic mental disorder, unspecified: Secondary | ICD-10-CM

## 2022-10-10 DIAGNOSIS — F1911 Other psychoactive substance abuse, in remission: Secondary | ICD-10-CM

## 2022-10-10 DIAGNOSIS — Z1211 Encounter for screening for malignant neoplasm of colon: Secondary | ICD-10-CM

## 2022-10-10 DIAGNOSIS — I509 Heart failure, unspecified: Secondary | ICD-10-CM

## 2022-10-10 DIAGNOSIS — Z8619 Personal history of other infectious and parasitic diseases: Secondary | ICD-10-CM

## 2022-10-10 DIAGNOSIS — Z7689 Persons encountering health services in other specified circumstances: Secondary | ICD-10-CM

## 2022-10-10 MED ORDER — LISINOPRIL 5 MG PO TABS: 5.0000 mg | ORAL_TABLET | Freq: Every day | ORAL | 0 refills | Status: DC

## 2022-10-10 MED ORDER — METOPROLOL SUCCINATE ER 25 MG PO TB24: 25.0000 mg | ORAL_TABLET | Freq: Every day | ORAL | 0 refills | Status: DC

## 2022-10-10 NOTE — Progress Notes (Signed)
BP 100/70   Pulse 100   Temp 99.1 F (37.3 C)   Ht 6\' 2"  (1.88 m)   Wt 171 lb 4 oz (77.7 kg)   SpO2 98%   BMI 21.99 kg/m    Subjective:    Patient ID: , male    DOB: [birth year], 46 y.o.   MRN: [id number]  HPI: Donald George is a 46 y.o. male presenting on 10/10/2022 for New Patient (Initial Visit) (Pt just got out of rehab for drug use on Thurs. 10-05-22. Pt was in there since 09-14-22. Pt states he was told he has A-fib and CHF. Pt states he had a "cardiac event" caused by drugs 07/06/2022. Pt states he is feeling pretty good today. Pt states it was recommended by hospital to see cardiologist./)   HPI  Chief Complaint  Patient presents with   New Patient (Initial Visit)    Pt just got out of rehab for drug use on Thurs. 10-05-22. Pt was in there since 09-14-22. Pt states he was told he has A-fib and CHF. Pt states he had a "cardiac event" caused by drugs 07/06/2022. Pt states it was recommended by hospital to see cardiologist. Pt states he is "feeling pretty good today".       Pt had Well visit 11/15/21 at facility in [facility] by dr [facility].  Review of those records:  .  Pt with hx hep C s/p treatment with cirrhosis, sub abuse d/o and major depression.  Extensive labs-  all unremarkable.    Pt saw cardiology 01/05/21- dx tachycardia and substance abuse.  TTE- 12/16/20- normal  Pt admitted 07/06/22 with overdose.  Found to be in AF with CHF.   Multiple inpatient hospitalizations at behavioral hospital - in 2013, 2014 2021 and twice in 2022.   Multiple overdoses, SI    He was user of Meth benzos cocaine.  He had IVDU.  He has hx heavy etoh.     EKG- 08/12/22- NSR at 72bpm  EKG 08/09/22- NSR a 76 bpm  TTE 07/07/22-   EF 35% with global hypokinesis  Labs:   BNP - 6   -on 08/04/22   Pt moved back in with his parents last week due to his issues   Pt has not gotten established with MH since moving back to the area last week.   No NA/AA meetings since [facility]   He has only been on the levothyroxine for a little while/less than a month- got put on it at [facility].  He has some trouble with memory and focus   Relevant past medical, surgical, family and social history reviewed and updated as indicated. Interim medical history since our last visit reviewed. Allergies and medications reviewed and updated.    Current Outpatient Medications:    buprenorphine-naloxone (SUBOXONE) 8-2 MG SUBL SL tablet, Place 0.5 tablets under the tongue daily. 12mg  daily, Disp: , Rfl:    Levothyroxine Sodium (SYNTHROID PO), Take 0.05 mg by mouth daily., Disp: , Rfl:    lisinopril (ZESTRIL) 5 MG tablet, Take 5 mg by mouth daily., Disp: , Rfl:    metoprolol tartrate (LOPRESSOR) 25 MG tablet, Take 25 mg by mouth daily., Disp: , Rfl:    risperiDONE (RISPERDAL) 1 MG tablet, Take 1 mg by mouth 2 (two) times daily., Disp: , Rfl:     Review of Systems  Per HPI unless specifically indicated above     Objective:    BP 100/70  Pulse 100   Temp 99.1 F (37.3 C)   Ht 6\' 2"  (1.88 m)   Wt 171 lb 4 oz (77.7 kg)   SpO2 98%   BMI 21.99 kg/m   Wt Readings from Last 3 Encounters:  10/10/22 171 lb 4 oz (77.7 kg)  05/13/15 168 lb 9.6 oz (76.5 kg)  10/30/14 173 lb (78.5 kg)    Physical Exam Vitals reviewed.  Constitutional:      General: He is not in acute distress.    Appearance: He is not toxic-appearing.  HENT:     Head: Normocephalic and atraumatic.     Right Ear: There is impacted cerumen.     Left Ear: There is impacted cerumen.  Eyes:     Extraocular Movements: Extraocular movements intact.     Conjunctiva/sclera: Conjunctivae normal.     Pupils: Pupils are equal, round, and reactive to light.  Neck:     Thyroid: No thyromegaly.  Cardiovascular:     Rate and Rhythm: Normal rate and regular rhythm.  Pulmonary:     Effort: Pulmonary effort is normal.     Breath sounds: Normal breath sounds. No wheezing or rales.  Abdominal:      General: Bowel sounds are normal.     Palpations: Abdomen is soft. There is no mass.     Tenderness: There is no abdominal tenderness.  Musculoskeletal:     Cervical back: Neck supple.     Right lower leg: No edema.     Left lower leg: No edema.  Lymphadenopathy:     Cervical: No cervical adenopathy.  Skin:    General: Skin is warm and dry.     Findings: No rash.  Neurological:     Mental Status: He is alert and oriented to person, place, and time.     Motor: No weakness or tremor.     Gait: Gait is intact.  Psychiatric:        Attention and Perception: Attention normal.        Speech: Speech normal.        Behavior: Behavior normal. Behavior is cooperative.     Comments: Well-groomed, engaged           Assessment & Plan:     Encounter Diagnoses  Name Primary?   Encounter to establish care Yes   Congestive heart failure, unspecified HF chronicity, unspecified heart failure type (HCC)    Substance abuse in remission [facility])    Mental health problem    Screening for colon cancer    History of hepatitis C    History of cirrhosis      HCM: -pt is given FIT test for colon cancer screening -labs in epic reviewed.  No additional labs at this time  CHF -update Echo.  Pt may need referral to cardiology.  -continue toprol xl -If bp continues to be low at next appt may d/c lisinopril  uninsured -pt is given application for cafa[employer] financial assistance to cover echo and referral if needed  Substance abuse/depression -pt to contact [facility] -he is given handout with local AA/NA meetings -Suboxone and risperone per [facility]  History cirrhosis -[facility] 01/10/21 WNL -LFTs normal 08/12/22  History hepatitis C -s/p treatment  -D/c levothyroxine and recheck labs 6 wk.  -F/u 1 month.  Pt to contact office sooner prn

## 2022-10-15 ENCOUNTER — Encounter: Payer: Self-pay | Admitting: Physician Assistant

## 2022-10-17 ENCOUNTER — Telehealth: Payer: Self-pay

## 2022-10-17 NOTE — Telephone Encounter (Signed)
Attempted to follow up with patient to receive a report on the [facility] and to address in medical needs, concerns r/t to obtaining medications, etc  Will make an  second attempt to follow up with patient on 11.29.23 to address any medical needs

## 2022-10-18 ENCOUNTER — Telehealth: Payer: Self-pay

## 2022-10-23 ENCOUNTER — Ambulatory Visit (HOSPITAL_COMMUNITY)
Admission: RE | Admit: 2022-10-23 | Discharge: 2022-10-23 | Disposition: A | Discharge status: Home / Self Care | Payer: Medicaid Other | Source: Ambulatory Visit | Attending: Physician Assistant | Admitting: Physician Assistant

## 2022-10-23 DIAGNOSIS — I509 Heart failure, unspecified: Secondary | ICD-10-CM | POA: Diagnosis present

## 2022-10-23 LAB — ECHOCARDIOGRAM COMPLETE
AR max vel: 2.13 cm2
AV Area VTI: 2.22 cm2
AV Area mean vel: 2.11 cm2
AV Mean grad: 4.2 mmHg
AV Peak grad: 8.5 mmHg
Ao pk vel: 1.46 m/s
Area-P 1/2: 3.6 cm2
S' Lateral: 3.2 cm

## 2022-10-23 NOTE — Progress Notes (Signed)
*  PRELIMINARY RESULTS* Echocardiogram 2D Echocardiogram has been performed.  Stacey Drain 10/23/2022, 2:40 PM

## 2022-10-26 ENCOUNTER — Other Ambulatory Visit: Payer: Self-pay | Admitting: Physician Assistant

## 2022-10-26 MED ORDER — METOPROLOL SUCCINATE ER 25 MG PO TB24: 25.0000 mg | ORAL_TABLET | Freq: Every day | ORAL | 1 refills | Status: DC

## 2022-10-26 MED ORDER — LISINOPRIL 5 MG PO TABS: 5.0000 mg | ORAL_TABLET | Freq: Every day | ORAL | 1 refills | Status: DC

## 2022-11-07 ENCOUNTER — Ambulatory Visit: Payer: Self-pay | Admitting: Physician Assistant

## 2022-11-16 ENCOUNTER — Encounter (INDEPENDENT_AMBULATORY_CARE_PROVIDER_SITE_OTHER): Payer: Self-pay | Admitting: *Deleted

## 2022-11-21 NOTE — Progress Notes (Signed)
Cardiology Office Note:    Date:  11/24/2022   ID:  Donald George, DOB 1976/05/07, MRN [id number]  PCP:  Olga Coaster, FNP  Cardiologist:  None  Electrophysiologist:  None   Referring MD: Olga Coaster, FNP   Chief Complaint  Patient presents with   Congestive Heart Failure    History of Present Illness:    Donald George is a 47 y.o. male with a hx of polysubstance abuse including methamphetamine and cocaine, cirrhosis, atrial fibrillation, heart failure who is referred by Rubin Payor, NP for evaluation of heart failure.  Previously seen by cardiology in [place], [place], had echocardiogram that was unremarkable.  He was admitted 06/2022 with unintentional overdose with fentanyl and methamphetamine.  Developed A-fib with RVR during admission, likely due to overdose.  Echocardiogram showed EF 35% with global hypokinesis.  He was started on Toprol XL 25 mg daily and lisinopril 5 mg daily.  Echocardiogram 10/23/2022 showed EF 60 to 72%, normal diastolic function, normal RV function, no significant valvular disease.  He went through rehab and reports no drug use or cigarette since 10/01/2022.  Denies any chest pain, dyspnea, lightheadedness, syncope, lower extremity edema, or palpitations.    Past Medical History:  Diagnosis Date   Anxiety    CHF (congestive heart failure) (HCC)    Cirrhosis of liver ([place]) 2018   Depression    Drug addiction ([place])    since age 46 to opiates and etoh. Was an IV drug user.   Hepatitis C 2003   pt has had treatment 2018   Hypertension    Hypothyroidism    Major depressive disorder     Past Surgical History:  Procedure Laterality Date   Removal of cyst from throat     as a child (81yrs of age)    Current Medications: Current Meds  Medication Sig   buprenorphine-naloxone (SUBOXONE) 8-2 MG SUBL SL tablet Place 0.5 tablets under the tongue daily. 12mg  daily   lisinopril (ZESTRIL) 5 MG tablet Take 1 tablet (5 mg total) by mouth daily.    metoprolol succinate (TOPROL-XL) 25 MG 24 hr tablet Take 1 tablet (25 mg total) by mouth daily.   risperiDONE (RISPERDAL) 1 MG tablet Take 1 mg by mouth 2 (two) times daily.     Allergies:   Patient has no known allergies.   Social History   Socioeconomic History   Marital status: Single    Spouse name: Not on file   Number of children: Not on file   Years of education: Not on file   Highest education level: Not on file  Occupational History   Not on file  Tobacco Use   Smoking status: Former    Packs/day: 0.50    Years: 30.00    Total pack years: 15.00    Types: Cigarettes    Quit date: 09/30/2022    Years since quitting: 0.1   Smokeless tobacco: Never  Vaping Use   Vaping Use: Never used  Substance and Sexual Activity   Alcohol use: Not Currently    Comment: last ETOH use 09-13-2022. previously heavy drinker since age 47   Drug use: Yes    Types: Cocaine, Methamphetamines, Opium, Benzodiazepines    Comment: last opiod use 10/2020, last meth use 09/14/2022.   Sexual activity: Not on file  Other Topics Concern   Not on file  Social History Narrative   Not on file   Social Determinants of Health   Financial Resource Strain: Not  on file  Food Insecurity: Not on file  Transportation Needs: Not on file  Physical Activity: Not on file  Stress: Not on file  Social Connections: Not on file     Family History: The patient's family history includes Cancer in his father; Diabetes in his maternal grandmother.  ROS:   Please see the history of present illness.     All other systems reviewed and are negative.  EKGs/Labs/Other Studies Reviewed:    The following studies were reviewed today:  EKG:   11/24/22: NSR, rate 75, no ST abnormalities  Recent Labs: No results found for requested labs within last 365 days.  Recent Lipid Panel No results found for: "CHOL", "TRIG", "HDL", "CHOLHDL", "VLDL", "LDLCALC", "LDLDIRECT"  Physical Exam:    VS:  BP 132/68 (BP Location:  Left Arm, Patient Position: Sitting, Cuff Size: Normal)   Pulse 75   Ht 6' (1.829 m)   Wt 183 lb 3.2 oz (83.1 kg)   SpO2 98%   BMI 24.85 kg/m     Wt Readings from Last 3 Encounters:  11/24/22 183 lb 3.2 oz (83.1 kg)  10/10/22 171 lb 4 oz (77.7 kg)  05/13/15 168 lb 9.6 oz (76.5 kg)     GEN:  Well nourished, well developed in no acute distress HEENT: Normal NECK: No JVD; No carotid bruits LYMPHATICS: No lymphadenopathy CARDIAC: RRR, no murmurs, rubs, gallops RESPIRATORY:  Clear to auscultation without rales, wheezing or rhonchi  ABDOMEN: Soft, non-tender, non-distended MUSCULOSKELETAL:  No edema; No deformity  SKIN: Warm and dry NEUROLOGIC:  Alert and oriented x 3 PSYCHIATRIC:  Normal affect   ASSESSMENT:    1. Acute combined systolic (congestive) and diastolic (congestive) heart failure ([place])   2. PAF (paroxysmal atrial fibrillation) ([place])   3. Polysubstance abuse ([place])    PLAN:    Acute combined heart failure: Admitted 06/2022 with unintentional overdose with fentanyl and methamphetamine.  Developed A-fib with RVR during admission, likely due to overdose.  Echocardiogram showed EF 35% with global hypokinesis.  He was started on Toprol XL 25 mg daily and lisinopril 5 mg daily.  Echocardiogram 10/23/2022 showed EF 60 to 09%, normal diastolic function, normal RV function, no significant valvular disease. -Likely due to overdose, subsequently had recovery of systolic function -Continue Toprol-XL and lisinopril  Atrial fibrillation: Occurred during admission following fentanyl/methamphetamine overdose 06/2022.  Likely secondary to substance abuse.  CHA2DS2-VASc score 1 (CHF)  Polysubstance abuse: completed rehab, reports no drug uses since 10/01/2022.  Also stopped smoking cigarettes.  Congratulated patient on quitting and encouraged continued cessation  Cirrhosis: Likely due to HCV versus alcohol abuse  RTC in 6 months   Medication Adjustments/Labs and Tests Ordered: Current  medicines are reviewed at length with the patient today.  Concerns regarding medicines are outlined above.  Orders Placed This Encounter  Procedures   Basic metabolic panel   EKG 98-PJAS   No orders of the defined types were placed in this encounter.   Patient Instructions  Medication Instructions:  The current medical regimen is effective;  continue present plan and medications.  *If you need a refill on your cardiac medications before your next appointment, please call your pharmacy*   Lab Work: BMET today   If you have labs (blood work) drawn today and your tests are completely normal, you will receive your results only by: [place] (if you have MyChart) OR A paper copy in the mail If you have any lab test that is abnormal or we need to  change your treatment, we will call you to review the results.   Follow-Up: At [facility], you and your health needs are our priority.  As part of our continuing mission to provide you with exceptional heart care, we have created designated Provider Care Teams.  These Care Teams include your primary Cardiologist (physician) and Advanced Practice Providers (APPs -  Physician Assistants and Nurse Practitioners) who all work together to provide you with the care you need, when you need it.  We recommend signing up for the patient portal called "MyChart".  Sign up information is provided on this After Visit Summary.  MyChart is used to connect with patients for Virtual Visits (Telemedicine).  Patients are able to view lab/test results, encounter notes, upcoming appointments, etc.  Non-urgent messages can be sent to your provider as well.   To learn more about what you can do with MyChart, go to [email or web address].    Your next appointment:   6 month(s)  The format for your next appointment:   In Person  Provider:   Epifanio Lesches, MD         Signed, Little Ishikawa, MD  11/24/2022 8:37 AM    [facility]

## 2022-11-24 ENCOUNTER — Ambulatory Visit: Payer: Medicaid Other | Attending: Cardiology | Admitting: Cardiology

## 2022-11-24 ENCOUNTER — Encounter: Payer: Self-pay | Admitting: Cardiology

## 2022-11-24 VITALS — BP 132/68 | HR 75 | Ht 72.0 in | Wt 183.2 lb

## 2022-11-24 DIAGNOSIS — I5041 Acute combined systolic (congestive) and diastolic (congestive) heart failure: Secondary | ICD-10-CM | POA: Insufficient documentation

## 2022-11-24 DIAGNOSIS — I480 Paroxysmal atrial fibrillation: Secondary | ICD-10-CM | POA: Diagnosis not present

## 2022-11-24 DIAGNOSIS — F1910 Other psychoactive substance abuse, uncomplicated: Secondary | ICD-10-CM | POA: Insufficient documentation

## 2022-11-24 LAB — BASIC METABOLIC PANEL
BUN/Creatinine Ratio: 14 (ref 9–20)
BUN: 12 mg/dL (ref 6–24)
CO2: 26 mmol/L (ref 20–29)
Calcium: 9.7 mg/dL (ref 8.7–10.2)
Chloride: 100 mmol/L (ref 96–106)
Creatinine, Ser: 0.83 mg/dL (ref 0.76–1.27)
Glucose: 111 mg/dL — ABNORMAL HIGH (ref 70–99)
Potassium: 4.4 mmol/L (ref 3.5–5.2)
Sodium: 140 mmol/L (ref 134–144)
eGFR: 110 mL/min/{1.73_m2} (ref >59)

## 2022-11-24 NOTE — Patient Instructions (Signed)
Medication Instructions:  The current medical regimen is effective;  continue present plan and medications.  *If you need a refill on your cardiac medications before your next appointment, please call your pharmacy*   Lab Work: BMET today   If you have labs (blood work) drawn today and your tests are completely normal, you will receive your results only by: [place] (if you have MyChart) OR A paper copy in the mail If you have any lab test that is abnormal or we need to change your treatment, we will call you to review the results.   Follow-Up: At [facility], you and your health needs are our priority.  As part of our continuing mission to provide you with exceptional heart care, we have created designated Provider Care Teams.  These Care Teams include your primary Cardiologist (physician) and Advanced Practice Providers (APPs -  Physician Assistants and Nurse Practitioners) who all work together to provide you with the care you need, when you need it.  We recommend signing up for the patient portal called "MyChart".  Sign up information is provided on this After Visit Summary.  MyChart is used to connect with patients for Virtual Visits (Telemedicine).  Patients are able to view lab/test results, encounter notes, upcoming appointments, etc.  Non-urgent messages can be sent to your provider as well.   To learn more about what you can do with MyChart, go to [email or web address].    Your next appointment:   6 month(s)  The format for your next appointment:   In Person  Provider:   Oswaldo Milian, MD

## 2022-12-12 ENCOUNTER — Telehealth: Payer: Self-pay | Admitting: *Deleted

## 2022-12-12 NOTE — Telephone Encounter (Signed)
Referring MD/PCP: Leandro Reasoner Bucio  Procedure: Colonoscopy  Reason/Indication:  screening  Has patient had this procedure before?  no  If so, when, by whom and where?    Is there a family history of colon cancer?  no  Who?  What age when diagnosed?    Is patient diabetic? If yes, Type 1 or Type 2   no      Does patient have prosthetic heart valve or mechanical valve?  no  Do you have a pacemaker/defibrillator?  no  Has patient ever had endocarditis/atrial fibrillation? Yes AFIB BUT NOT CURRENTLY  Does patient use oxygen? NO  Has patient had joint replacement within last 12 months?  no  Is patient constipated or do they take laxatives? no  Does patient have a history of alcohol/drug use?  yes  Have you had a stroke/heart attack last 6 mths? no  Do you take medicine for weight loss?  no   Is patient on blood thinner such as Coumadin, Plavix and/or Aspirin? no  Medications:  Current Outpatient Medications on File Prior to Visit  Medication Sig Dispense Refill   buprenorphine-naloxone (SUBOXONE) 8-2 MG SUBL SL tablet Place 0.5 tablets under the tongue daily. 12mg  daily     lisinopril (ZESTRIL) 5 MG tablet Take 1 tablet (5 mg total) by mouth daily. 30 tablet 1   metoprolol succinate (TOPROL-XL) 25 MG 24 hr tablet Take 1 tablet (25 mg total) by mouth daily. 30 tablet 1   risperiDONE (RISPERDAL) 1 MG tablet Take 1 mg by mouth 2 (two) times daily.     No current facility-administered medications on file prior to visit.     Allergies: No Known Allergies   [facility]

## 2022-12-12 NOTE — Telephone Encounter (Signed)
Room 3, needs 2-day prep and he needs to have urine toxicology performed a week before his procedure Thanks

## 2022-12-13 NOTE — Telephone Encounter (Signed)
LMOVM to call back 

## 2022-12-14 NOTE — Telephone Encounter (Signed)
Called pt, LMOVM 

## 2022-12-28 ENCOUNTER — Encounter: Payer: Self-pay | Admitting: *Deleted

## 2022-12-28 ENCOUNTER — Encounter (INDEPENDENT_AMBULATORY_CARE_PROVIDER_SITE_OTHER): Payer: Self-pay | Admitting: *Deleted

## 2022-12-28 MED ORDER — PEG 3350-KCL-NA BICARB-NACL 420 G PO SOLR: 4000.0000 mL | Freq: Once | ORAL | 0 refills | Status: AC

## 2022-12-28 NOTE — Addendum Note (Signed)
Addended by: Cheron Every on: 12/28/2022 08:57 AM   Modules accepted: Orders

## 2022-12-28 NOTE — Telephone Encounter (Signed)
PT CALLED BACK. He has been scheduled for 3/12 at 8:15am. Aware will send instructions via mychart. Rx for prep sent to pharmacy, pre-0op will also be sent to [facility]

## 2022-12-28 NOTE — Telephone Encounter (Signed)
Referral completed

## 2023-01-02 ENCOUNTER — Telehealth: Payer: Self-pay | Admitting: Cardiology

## 2023-01-02 MED ORDER — LISINOPRIL 5 MG PO TABS: 5.0000 mg | ORAL_TABLET | Freq: Every day | ORAL | 0 refills | Status: DC

## 2023-01-02 MED ORDER — METOPROLOL SUCCINATE ER 25 MG PO TB24: 25.0000 mg | ORAL_TABLET | Freq: Every day | ORAL | 0 refills | Status: DC

## 2023-01-02 NOTE — Telephone Encounter (Signed)
*  STAT* If patient is at the pharmacy, call can be transferred to refill team.   1. Which medications need to be refilled? (please list name of each medication and dose if known)    lisinopril (ZESTRIL) 5 MG tablet Take 1 tablet (5 mg total) by mouth daily.   metoprolol succinate (TOPROL-XL) 25 MG 24 hr tablet Take 1 tablet (25 mg total) by mouth daily.    2. Which pharmacy/location (including street and city if local pharmacy) is medication to be sent to?   [place]     3. Do they need a 30 day or 90 day supply? [place]

## 2023-01-24 NOTE — Patient Instructions (Addendum)
Donald George  01/24/2023     '@PREFPERIOPPHARMACY'$ @   Your procedure is scheduled on  01/30/2023.   Report to [facility] at  [email or web address]   Call this number if you have problems the morning of surgery:  [phone]  If you experience any cold or flu symptoms such as cough, fever, chills, shortness of breath, etc. between now and your scheduled surgery, please notify us at the above number.   Remember:  Follow the diet and prep instructions given to you by the office.    Take these medicines the morning of surgery with A SIP OF WATER                 suboxone, metoprolol, risperdal.     Do not wear jewelry, make-up or nail polish.  Do not wear lotions, powders, or perfumes, or deodorant.  Do not shave 48 hours prior to surgery.  Men may shave face and neck.  Do not bring valuables to the hospital.  [facility] is not responsible for any belongings or valuables.  Contacts, dentures or bridgework may not be worn into surgery.  Leave your suitcase in the car.  After surgery it may be brought to your room.  For patients admitted to the hospital, discharge time will be determined by your treatment team.  Patients discharged the day of surgery will not be allowed to drive home and must have someone with them for 24 hours.    Special instructions:   DO NOT smoke tobacco or vape for 24 hours before your procedure.  Please read over the following fact sheets that you were given. Anesthesia Post-op Instructions and Care and Recovery After Surgery       Colonoscopy, Adult, Care After The following information offers guidance on how to care for yourself after your procedure. Your health care provider may also give you more specific instructions. If you have problems or questions, contact your health care provider. What can I expect after the procedure? After the procedure, it is common to have: A small amount of blood in your stool for 24 hours after the procedure. Some  gas. Mild cramping or bloating of your abdomen. Follow these instructions at home: Eating and drinking  Drink enough fluid to keep your urine pale yellow. Follow instructions from your health care provider about eating or drinking restrictions. Resume your normal diet as told by your health care provider. Avoid heavy or fried foods that are hard to digest. Activity Rest as told by your health care provider. Avoid sitting for a long time without moving. Get up to take short walks every 1-2 hours. This is important to improve blood flow and breathing. Ask for help if you feel weak or unsteady. Return to your normal activities as told by your health care provider. Ask your health care provider what activities are safe for you. Managing cramping and bloating  Try walking around when you have cramps or feel bloated. If directed, apply heat to your abdomen as told by your health care provider. Use the heat source that your health care provider recommends, such as a moist heat pack or a heating pad. Place a towel between your skin and the heat source. Leave the heat on for 20-30 minutes. Remove the heat if your skin turns bright red. This is especially important if you are unable to feel pain, heat, or cold. You have a greater risk of getting burned. General instructions If you  were given a sedative during the procedure, it can affect you for several hours. Do not drive or operate machinery until your health care provider says that it is safe. For the first 24 hours after the procedure: Do not sign important documents. Do not drink alcohol. Do your regular daily activities at a slower pace than normal. Eat soft foods that are easy to digest. Take over-the-counter and prescription medicines only as told by your health care provider. Keep all follow-up visits. This is important. Contact a health care provider if: You have blood in your stool 2-3 days after the procedure. Get help right away  if: You have more than a small spotting of blood in your stool. You have large blood clots in your stool. You have swelling of your abdomen. You have nausea or vomiting. You have a fever. You have increasing pain in your abdomen that is not relieved with medicine. These symptoms may be an emergency. Get help right away. Call 911. Do not wait to see if the symptoms will go away. Do not drive yourself to the hospital. Summary After the procedure, it is common to have a small amount of blood in your stool. You may also have mild cramping and bloating of your abdomen. If you were given a sedative during the procedure, it can affect you for several hours. Do not drive or operate machinery until your health care provider says that it is safe. Get help right away if you have a lot of blood in your stool, nausea or vomiting, a fever, or increased pain in your abdomen. This information is not intended to replace advice given to you by your health care provider. Make sure you discuss any questions you have with your health care provider. Document Revised: 06/29/2021 Document Reviewed: 06/29/2021 Elsevier Patient Education  [place] After The following information offers guidance on how to care for yourself after your procedure. Your health care provider may also give you more specific instructions. If you have problems or questions, contact your health care provider. What can I expect after the procedure? After the procedure, it is common to have: Tiredness. Little or no memory about what happened during or after the procedure. Impaired judgment when it comes to making decisions. Nausea or vomiting. Some trouble with balance. Follow these instructions at home: For the time period you were told by your health care provider:  Rest. Do not participate in activities where you could fall or become injured. Do not drive or use machinery. Do not drink  alcohol. Do not take sleeping pills or medicines that cause drowsiness. Do not make important decisions or sign legal documents. Do not take care of children on your own. Medicines Take over-the-counter and prescription medicines only as told by your health care provider. If you were prescribed antibiotics, take them as told by your health care provider. Do not stop using the antibiotic even if you start to feel better. Eating and drinking Follow instructions from your health care provider about what you may eat and drink. Drink enough fluid to keep your urine pale yellow. If you vomit: Drink clear fluids slowly and in small amounts as you are able. Clear fluids include water, ice chips, low-calorie sports drinks, and fruit juice that has water added to it (diluted fruit juice). Eat light and bland foods in small amounts as you are able. These foods include bananas, applesauce, rice, lean meats, toast, and crackers. General instructions  Have a responsible  adult stay with you for the time you are told. It is important to have someone help care for you until you are awake and alert. If you have sleep apnea, surgery and some medicines can increase your risk for breathing problems. Follow instructions from your health care provider about wearing your sleep device: When you are sleeping. This includes during daytime naps. While taking prescription pain medicines, sleeping medicines, or medicines that make you drowsy. Do not use any products that contain nicotine or tobacco. These products include cigarettes, chewing tobacco, and vaping devices, such as e-cigarettes. If you need help quitting, ask your health care provider. Contact a health care provider if: You feel nauseous or vomit every time you eat or drink. You feel light-headed. You are still sleepy or having trouble with balance after 24 hours. You get a rash. You have a fever. You have redness or swelling around the IV site. Get help  right away if: You have trouble breathing. You have new confusion after you get home. These symptoms may be an emergency. Get help right away. Call 911. Do not wait to see if the symptoms will go away. Do not drive yourself to the hospital. This information is not intended to replace advice given to you by your health care provider. Make sure you discuss any questions you have with your health care provider. Document Revised: 04/03/2022 Document Reviewed: 04/03/2022 Elsevier Patient Education  [place].

## 2023-01-26 ENCOUNTER — Encounter (HOSPITAL_COMMUNITY): Payer: Self-pay

## 2023-01-26 ENCOUNTER — Encounter (HOSPITAL_COMMUNITY)
Admission: RE | Admit: 2023-01-26 | Discharge: 2023-01-26 | Disposition: A | Discharge status: Home / Self Care | Payer: Medicaid Other | Source: Ambulatory Visit | Attending: Gastroenterology | Admitting: Gastroenterology

## 2023-01-26 VITALS — BP 115/77 | HR 87 | Temp 97.5°F | Resp 18 | Ht 72.0 in | Wt 183.2 lb

## 2023-01-26 DIAGNOSIS — Z01812 Encounter for preprocedural laboratory examination: Secondary | ICD-10-CM | POA: Insufficient documentation

## 2023-01-26 DIAGNOSIS — B1920 Unspecified viral hepatitis C without hepatic coma: Secondary | ICD-10-CM | POA: Insufficient documentation

## 2023-01-26 DIAGNOSIS — F1920 Other psychoactive substance dependence, uncomplicated: Secondary | ICD-10-CM | POA: Diagnosis not present

## 2023-01-26 LAB — RAPID URINE DRUG SCREEN, HOSP PERFORMED
Amphetamines: NOT DETECTED
Barbiturates: NOT DETECTED
Benzodiazepines: NOT DETECTED
Cocaine: NOT DETECTED
Opiates: NOT DETECTED
Tetrahydrocannabinol: POSITIVE — AB

## 2023-01-26 LAB — COMPREHENSIVE METABOLIC PANEL
ALT: 18 U/L (ref 0–44)
AST: 23 U/L (ref 15–41)
Albumin: 4.3 g/dL (ref 3.5–5.0)
Alkaline Phosphatase: 56 U/L (ref 38–126)
Anion gap: 10 (ref 5–15)
BUN: 14 mg/dL (ref 6–20)
CO2: 25 mmol/L (ref 22–32)
Calcium: 8.9 mg/dL (ref 8.9–10.3)
Chloride: 102 mmol/L (ref 98–111)
Creatinine, Ser: 0.82 mg/dL (ref 0.61–1.24)
GFR, Estimated: >60 mL/min (ref >60)
Glucose, Bld: 161 mg/dL — ABNORMAL HIGH (ref 70–99)
Potassium: 4.1 mmol/L (ref 3.5–5.1)
Sodium: 137 mmol/L (ref 135–145)
Total Bilirubin: 0.3 mg/dL (ref 0.3–1.2)
Total Protein: 7.3 g/dL (ref 6.5–8.1)

## 2023-01-26 LAB — CBC WITH DIFFERENTIAL/PLATELET
Abs Immature Granulocytes: 0.01 10*3/uL (ref 0.00–0.07)
Basophils Absolute: 0 10*3/uL (ref 0.0–0.1)
Basophils Relative: 1 %
Eosinophils Absolute: 0.2 10*3/uL (ref 0.0–0.5)
Eosinophils Relative: 4 %
HCT: 42.1 % (ref 39.0–52.0)
Hemoglobin: 14.3 g/dL (ref 13.0–17.0)
Immature Granulocytes: 0 %
Lymphocytes Relative: 34 %
Lymphs Abs: 1.5 10*3/uL (ref 0.7–4.0)
MCH: 29.3 pg (ref 26.0–34.0)
MCHC: 34 g/dL (ref 30.0–36.0)
MCV: 86.3 fL (ref 80.0–100.0)
Monocytes Absolute: 0.2 10*3/uL (ref 0.1–1.0)
Monocytes Relative: 5 %
Neutro Abs: 2.4 10*3/uL (ref 1.7–7.7)
Neutrophils Relative %: 56 %
Platelets: 220 10*3/uL (ref 150–400)
RBC: 4.88 MIL/uL (ref 4.22–5.81)
RDW: 12.2 % (ref 11.5–15.5)
WBC: 4.3 10*3/uL (ref 4.0–10.5)
nRBC: 0 % (ref 0.0–0.2)

## 2023-01-26 LAB — PROTIME-INR
INR: 1 (ref 0.8–1.2)
Prothrombin Time: 13.4 seconds (ref 11.4–15.2)

## 2023-01-30 ENCOUNTER — Encounter (HOSPITAL_COMMUNITY): Payer: Self-pay | Admitting: Gastroenterology

## 2023-01-30 ENCOUNTER — Ambulatory Visit (HOSPITAL_COMMUNITY): Payer: 59 | Admitting: Certified Registered"

## 2023-01-30 ENCOUNTER — Encounter (HOSPITAL_COMMUNITY)
Admission: RE | Disposition: A | Discharge status: Home / Self Care | Payer: Self-pay | Source: Home / Self Care | Attending: Gastroenterology

## 2023-01-30 ENCOUNTER — Encounter (INDEPENDENT_AMBULATORY_CARE_PROVIDER_SITE_OTHER): Payer: Self-pay | Admitting: *Deleted

## 2023-01-30 ENCOUNTER — Ambulatory Visit (HOSPITAL_BASED_OUTPATIENT_CLINIC_OR_DEPARTMENT_OTHER): Payer: 59 | Admitting: Certified Registered"

## 2023-01-30 ENCOUNTER — Ambulatory Visit (HOSPITAL_COMMUNITY)
Admission: RE | Admit: 2023-01-30 | Discharge: 2023-01-30 | Disposition: A | Discharge status: Home / Self Care | Payer: 59 | Attending: Gastroenterology | Admitting: Gastroenterology

## 2023-01-30 ENCOUNTER — Other Ambulatory Visit: Payer: Self-pay

## 2023-01-30 DIAGNOSIS — I5043 Acute on chronic combined systolic (congestive) and diastolic (congestive) heart failure: Secondary | ICD-10-CM | POA: Diagnosis not present

## 2023-01-30 DIAGNOSIS — Z1211 Encounter for screening for malignant neoplasm of colon: Secondary | ICD-10-CM | POA: Diagnosis not present

## 2023-01-30 DIAGNOSIS — Z87891 Personal history of nicotine dependence: Secondary | ICD-10-CM

## 2023-01-30 DIAGNOSIS — E039 Hypothyroidism, unspecified: Secondary | ICD-10-CM | POA: Diagnosis not present

## 2023-01-30 DIAGNOSIS — I110 Hypertensive heart disease with heart failure: Secondary | ICD-10-CM | POA: Insufficient documentation

## 2023-01-30 DIAGNOSIS — I509 Heart failure, unspecified: Secondary | ICD-10-CM | POA: Insufficient documentation

## 2023-01-30 DIAGNOSIS — K7460 Unspecified cirrhosis of liver: Secondary | ICD-10-CM | POA: Diagnosis not present

## 2023-01-30 HISTORY — PX: COLONOSCOPY WITH PROPOFOL: SHX5780

## 2023-01-30 LAB — HM COLONOSCOPY

## 2023-01-30 SURGERY — COLONOSCOPY WITH PROPOFOL
Anesthesia: General

## 2023-01-30 MED ORDER — PROPOFOL 500 MG/50ML IV EMUL
INTRAVENOUS | Status: DC | PRN
  Administered 2023-01-30: 150 ug/kg/min via INTRAVENOUS

## 2023-01-30 MED ORDER — LACTATED RINGERS IV SOLN: INTRAVENOUS | Status: DC | PRN

## 2023-01-30 MED ORDER — LIDOCAINE HCL (CARDIAC) PF 100 MG/5ML IV SOSY
PREFILLED_SYRINGE | INTRAVENOUS | Status: DC | PRN
  Administered 2023-01-30: 50 mg via INTRAVENOUS

## 2023-01-30 MED ORDER — DEXMEDETOMIDINE HCL IN NACL 80 MCG/20ML IV SOLN
INTRAVENOUS | Status: DC | PRN
  Administered 2023-01-30: 8 ug via BUCCAL
  Administered 2023-01-30: 12 ug via BUCCAL
  Administered 2023-01-30: 8 ug via BUCCAL

## 2023-01-30 MED ORDER — DEXMEDETOMIDINE HCL IN NACL 80 MCG/20ML IV SOLN
INTRAVENOUS | Status: AC
  Filled 2023-01-30: qty 20

## 2023-01-30 MED ORDER — PROPOFOL 10 MG/ML IV BOLUS
INTRAVENOUS | Status: DC | PRN
  Administered 2023-01-30: 100 mg via INTRAVENOUS
  Administered 2023-01-30: 50 mg via INTRAVENOUS
  Administered 2023-01-30: 100 mg via INTRAVENOUS

## 2023-01-30 NOTE — Anesthesia Procedure Notes (Signed)
Date/Time: 01/30/2023 8:39 AM  Performed by: Orlie Dakin, CRNAPre-anesthesia Checklist: Patient identified, Emergency Drugs available, Suction available and Patient being monitored Patient Re-evaluated:Patient Re-evaluated prior to induction Oxygen Delivery Method: Nasal cannula Placement Confirmation: positive ETCO2

## 2023-01-30 NOTE — Op Note (Signed)
[facility] Patient Name: Donald George Procedure Date: 01/30/2023 8:06 AM MRN: [id number] Date of Birth: Aug 08, 1976 Attending MD: Maylon Peppers , , [id number] CSN: [id number] Age: 47 Admit Type: Outpatient Procedure:                Colonoscopy Indications:              Screening for colorectal malignant neoplasm Providers:                Maylon Peppers, Janeece Riggers, RN, Aram Candela Referring MD:              Medicines:                Monitored Anesthesia Care Complications:            No immediate complications. Estimated Blood Loss:     Estimated blood loss: none. Procedure:                Pre-Anesthesia Assessment:                           - Prior to the procedure, a History and Physical                            was performed, and patient medications, allergies                            and sensitivities were reviewed. The patient's                            tolerance of previous anesthesia was reviewed.                           - The risks and benefits of the procedure and the                            sedation options and risks were discussed with the                            patient. All questions were answered and informed                            consent was obtained.                           - ASA Grade Assessment: III - A patient with severe                            systemic disease.                           After obtaining informed consent, the colonoscope                            was passed under direct vision. Throughout the                            procedure, the patient's blood  pressure, pulse, and                            oxygen saturations were monitored continuously. The                            [id number] scope was introduced through                            the anus and advanced to the the cecum, identified                            by appendiceal orifice and ileocecal valve. The                            colonoscopy  was performed without difficulty. The                            patient tolerated the procedure well. The quality                            of the bowel preparation was adequate. Scope In: 8:19:53 AM Scope Out: 8:41:11 AM Scope Withdrawal Time: 0 hours 14 minutes 53 seconds  Total Procedure Duration: 0 hours 21 minutes 18 seconds  Findings:      The perianal and digital rectal examinations were normal.      The entire examined colon appeared normal on direct and retroflexion       views. Impression:               - The entire examined colon is normal on direct and                            retroflexion views.                           - No specimens collected. Moderate Sedation:      Per Anesthesia Care Recommendation:           - Discharge patient to home (ambulatory).                           - Resume previous diet.                           - Repeat colonoscopy in 10 years for screening                            purposes. Procedure Code(s):        --- Professional ---                           [id number], Colorectal cancer screening; colonoscopy on                            individual not meeting criteria for high risk Diagnosis Code(s):        --- Professional ---  Z12.11, Encounter for screening for malignant                            neoplasm of colon CPT copyright 2022 [employer]. All rights reserved. The codes documented in this report are preliminary and upon coder review may  be revised to meet current compliance requirements. Maylon Peppers, MD Maylon Peppers,  01/30/2023 8:43:43 AM This report has been signed electronically. Number of Addenda: 0

## 2023-01-30 NOTE — Discharge Instructions (Signed)
You are being discharged to home.  Resume your previous diet.  Your physician has recommended a repeat colonoscopy in 10 years for screening purposes.  

## 2023-01-30 NOTE — Anesthesia Postprocedure Evaluation (Signed)
Anesthesia Post Note  Patient: Donald George  Procedure(s) Performed: COLONOSCOPY WITH PROPOFOL  Patient location during evaluation: Phase II Anesthesia Type: General Level of consciousness: awake Pain management: pain level controlled Vital Signs Assessment: post-procedure vital signs reviewed and stable Respiratory status: spontaneous breathing and respiratory function stable Cardiovascular status: blood pressure returned to baseline and stable Postop Assessment: no headache and no apparent nausea or vomiting Anesthetic complications: no Comments: Late entry   No notable events documented.   Last Vitals:  Vitals:   01/30/23 0737 01/30/23 0844  BP: 94/73 102/75  Pulse: 71 68  Resp: 14 13  Temp: 37 C 36.9 C  SpO2: 98% 97%    Last Pain:  Vitals:   01/30/23 0844  TempSrc: Oral  PainSc: 0-No pain                 Louann Sjogren

## 2023-01-30 NOTE — Anesthesia Preprocedure Evaluation (Signed)
Anesthesia Evaluation  Patient identified by MRN, date of birth, ID band Patient awake    Reviewed: Allergy & Precautions, H&P , NPO status , Patient's Chart, lab work & pertinent test results, reviewed documented beta blocker date and time   Airway Mallampati: II  TM Distance: >3 FB Neck ROM: full    Dental no notable dental hx.    Pulmonary neg pulmonary ROS, former smoker   Pulmonary exam normal breath sounds clear to auscultation       Cardiovascular Exercise Tolerance: Good hypertension, +CHF   Rhythm:regular Rate:Normal     Neuro/Psych  PSYCHIATRIC DISORDERS Anxiety Depression    negative neurological ROS     GI/Hepatic negative GI ROS,,,(+) Cirrhosis       , Hepatitis -, C  Endo/Other  Hypothyroidism    Renal/GU negative Renal ROS  negative genitourinary   Musculoskeletal   Abdominal   Peds  Hematology negative hematology ROS (+)   Anesthesia Other Findings   Reproductive/Obstetrics negative OB ROS                             Anesthesia Physical Anesthesia Plan  ASA: 3  Anesthesia Plan: General   Post-op Pain Management:    Induction:   PONV Risk Score and Plan: Propofol infusion  Airway Management Planned:   Additional Equipment:   Intra-op Plan:   Post-operative Plan:   Informed Consent: I have reviewed the patients History and Physical, chart, labs and discussed the procedure including the risks, benefits and alternatives for the proposed anesthesia with the patient or authorized representative who has indicated his/her understanding and acceptance.     Dental Advisory Given  Plan Discussed with: CRNA  Anesthesia Plan Comments:        Anesthesia Quick Evaluation

## 2023-01-30 NOTE — Transfer of Care (Signed)
Immediate Anesthesia Transfer of Care Note  Patient: Donald George  Procedure(s) Performed: COLONOSCOPY WITH PROPOFOL  Patient Location: Short Stay  Anesthesia Type:General  Level of Consciousness: drowsy  Airway & Oxygen Therapy: Patient Spontanous Breathing  Post-op Assessment: Report given to RN and Post -op Vital signs reviewed and stable  Post vital signs: Reviewed and stable  Last Vitals:  Vitals Value Taken Time  BP    Temp    Pulse    Resp    SpO2      Last Pain:  Vitals:   01/30/23 0737  TempSrc: Oral      Patients Stated Pain Goal: 8 ([id number])  Complications: No notable events documented.

## 2023-01-30 NOTE — H&P (Signed)
Donald George is an 47 y.o. male.   Chief Complaint: screening colonoscopy HPI: 47 y/o M with PMH anxiety, cirrhosis, depression, HTN, hypothyroidism, drug addiction, CHF, coming for screening colonoscopy. The patient has never had a colonoscopy in the past.  The patient denies having any complaints such as melena, hematochezia, abdominal pain or distention, change in her bowel movement consistency or frequency, no changes in weight recently.  No family history of colorectal cancer.   Past Medical History:  Diagnosis Date   Anxiety    CHF (congestive heart failure) (HCC)    Cirrhosis of liver ([place]) 2018   Depression    Drug addiction ([place])    since age 60 to opiates and etoh. Was an IV drug user.   Hepatitis C 2003   pt has had treatment 2018   Hypertension    Hypothyroidism    Major depressive disorder     Past Surgical History:  Procedure Laterality Date   Removal of cyst from throat     as a child (37yr of age)    Family History  Problem Relation Age of Onset   Cancer Father        skin cancer   Diabetes Maternal Grandmother    Social History:  reports that he quit smoking about 4 months ago. His smoking use included cigarettes. He has a 15.00 pack-year smoking history. He has never used smokeless tobacco. He reports that he does not currently use alcohol. He reports current drug use. Drugs: Cocaine, Methamphetamines, Opium, and Benzodiazepines.  Allergies: No Known Allergies  Medications Prior to Admission  Medication Sig Dispense Refill   buprenorphine-naloxone (SUBOXONE) 8-2 MG SUBL SL tablet Place 1.5 tablets under the tongue daily. '12mg'$  daily     lisinopril (ZESTRIL) 5 MG tablet Take 1 tablet (5 mg total) by mouth daily. 90 tablet 0   metoprolol succinate (TOPROL-XL) 25 MG 24 hr tablet Take 1 tablet (25 mg total) by mouth daily. 90 tablet 0   Multiple Vitamins-Minerals (MULTIVITAMIN WITH MINERALS) tablet Take 1 tablet by mouth daily.     nicotine polacrilex (COMMIT)  4 MG lozenge Take 4 mg by mouth as needed for smoking cessation.     risperiDONE (RISPERDAL) 2 MG tablet Take 2 mg by mouth 2 (two) times daily.     nicotine (NICODERM CQ - DOSED IN MG/24 HOURS) 14 mg/24hr patch Place 14 mg onto the skin daily.      No results found for this or any previous visit (from the past 48 hour(s)). No results found.  Review of Systems  All other systems reviewed and are negative.   Blood pressure 94/73, pulse 71, temperature 98.6 F (37 C), temperature source Oral, resp. rate 14, height '6\' 1"'$  (1.854 m), weight 79.4 kg, SpO2 98 %. Physical Exam  GENERAL: The patient is AO x3, in no acute distress. HEENT: Head is normocephalic and atraumatic. EOMI are intact. Mouth is well hydrated and without lesions. NECK: Supple. No masses LUNGS: Clear to auscultation. No presence of rhonchi/wheezing/rales. Adequate chest expansion HEART: RRR, normal s1 and s2. ABDOMEN: Soft, nontender, no guarding, no peritoneal signs, and nondistended. BS +. No masses. EXTREMITIES: Without any cyanosis, clubbing, rash, lesions or edema. NEUROLOGIC: AOx3, no focal motor deficit. SKIN: no jaundice, no rashes  Assessment/Plan 47y/o M with PMH anxiety, cirrhosis, depression, HTN, hypothyroidism, drug addiction, CHF, coming for screening colonoscopy. The patient is at average risk for colorectal cancer.  We will proceed with colonoscopy today.   DMaylon Peppers  Catalina Lunger, MD 01/30/2023, 8:09 AM

## 2023-02-08 ENCOUNTER — Encounter (HOSPITAL_COMMUNITY): Payer: Self-pay | Admitting: Gastroenterology

## 2023-02-15 NOTE — Telephone Encounter (Signed)
Reviewed steps of transition of care to Medicaid with the patient to ensure no gaps in care

## 2023-03-08 ENCOUNTER — Encounter: Payer: Self-pay | Admitting: Gastroenterology

## 2023-03-08 ENCOUNTER — Ambulatory Visit (INDEPENDENT_AMBULATORY_CARE_PROVIDER_SITE_OTHER): Payer: 59 | Admitting: Gastroenterology

## 2023-03-08 VITALS — BP 119/77 | HR 73 | Temp 97.9°F | Ht 73.0 in | Wt 178.7 lb

## 2023-03-08 DIAGNOSIS — Z8619 Personal history of other infectious and parasitic diseases: Secondary | ICD-10-CM | POA: Insufficient documentation

## 2023-03-08 DIAGNOSIS — K7030 Alcoholic cirrhosis of liver without ascites: Secondary | ICD-10-CM

## 2023-03-08 NOTE — Patient Instructions (Signed)
Keep up the good work!  We are doing a special ultrasound of the liver to test the "stiffness".   We will check labs in Sept 2024. I will message on MyChart regarding the ultrasound findings and any further steps!  It was a pleasure to see you today. I want to create trusting relationships with patients and provide genuine, compassionate, and quality care. I truly value your feedback, so please be on the lookout for a survey regarding your visit with me today. I appreciate your time in completing this!    Gelene Mink, PhD, ANP-BC [facility]enter[facility]

## 2023-03-08 NOTE — Progress Notes (Unsigned)
Gastroenterology Office Note    Referring Provider: Shelby Dubin, FNP Primary Care Physician:  Shelby Dubin, FNP  Primary GI: Dr. Levon Hedger, previously Dr. Karilyn Cota.    Chief Complaint   Chief Complaint  Patient presents with   Cirrhosis    Here to get established     History of Present Illness   Donald George is a 47 y.o. male presenting today at the request of Bucio, Elsa C, FNP due to history of chronic hepatitis C. He was last seen by Dorene Ar, NP, in 2016. He is here to establish care again. There was concern about cirrhosis. Fibroscan in 2018 with elevated kPa score of 15.4. However, multiple abdominal imaging subsequently on file with normal liver.   He was treated by [facility] for chronic Hep C genotype 1b in 2019. Achieved SVR. When last seen in 2022 by [facility], plans had been for restaging fibrosis. Moved back to [place] from [place].    US abdomen complete in Feb 2024: unremarkable. Normal liver. No thrombocytopenia or splenomegaly on imaging. Recent LFTs normal. INR normal. No abdominal pain, N/V, changes in bowel habits, constipation, diarrhea, overt GI bleeding, GERD, dysphagia, unexplained weight loss, lack of appetite, unexplained weight gain. No mental status changes or confusion. No jaundice or pruritus.    He has a 44 year old son who lives in [place]. He moved back to [place] to remove himself from old habits. Last drug use in Oct 2023. No IV drug use.    Colonoscopy March 2024: normal colon.     Past Medical History:  Diagnosis Date   Anxiety    CHF (congestive heart failure)    Cirrhosis of liver 2018   Depression    Drug addiction    since age 61 to opiates and etoh. Was an IV drug user.   Hepatitis C 2003   pt has had treatment 2018   Hypertension    Hypothyroidism    Major depressive disorder     Past Surgical History:  Procedure Laterality Date   COLONOSCOPY WITH PROPOFOL N/A 01/30/2023   Procedure: COLONOSCOPY WITH  PROPOFOL;  Surgeon: Dolores Frame, MD;  Location: [facility];  Service: Gastroenterology;  Laterality: N/A;  815am, asa 3, needs UDS at pre-op   Removal of cyst from throat     as a child (13yrs of age)    Current Outpatient Medications  Medication Sig Dispense Refill   buprenorphine-naloxone (SUBOXONE) 8-2 MG SUBL SL tablet Place 1.5 tablets under the tongue daily. 12mg  daily     escitalopram (LEXAPRO) 10 MG tablet Take 10 mg by mouth daily.     lisinopril (ZESTRIL) 5 MG tablet Take 1 tablet (5 mg total) by mouth daily. 90 tablet 0   metoprolol succinate (TOPROL-XL) 25 MG 24 hr tablet Take 1 tablet (25 mg total) by mouth daily. 90 tablet 0   Multiple Vitamins-Minerals (MULTIVITAMIN WITH MINERALS) tablet Take 1 tablet by mouth daily.     nicotine (NICODERM CQ - DOSED IN MG/24 HOURS) 14 mg/24hr patch Place 14 mg onto the skin daily.     nicotine polacrilex (COMMIT) 4 MG lozenge Take 4 mg by mouth as needed for smoking cessation.     risperiDONE (RISPERDAL) 2 MG tablet Take 2 mg by mouth 2 (two) times daily.     No current facility-administered medications for this visit.    Allergies as of 03/08/2023   (No Known Allergies)  Family History  Problem Relation Age of Onset   Cancer Father        skin cancer   Diabetes Maternal Grandmother    Colon cancer Neg Hx    Colon polyps Neg Hx     Social History   Socioeconomic History   Marital status: Single    Spouse name: Not on file   Number of children: 1   Years of education: Not on file   Highest education level: Not on file  Occupational History   Not on file  Tobacco Use   Smoking status: Former    Packs/day: 0.50    Years: 30.00    Additional pack years: 0.00    Total pack years: 15.00    Types: Cigarettes    Quit date: 09/30/2022    Years since quitting: 0.4   Smokeless tobacco: Never  Vaping Use   Vaping Use: Never used  Substance and Sexual Activity   Alcohol use: Not Currently    Comment: last  ETOH use 09-13-2022. previously heavy drinker since age 59   Drug use: Not Currently    Types: Cocaine, Methamphetamines, Opium, Benzodiazepines    Comment: last opiod use 10/2020, last meth use 09/14/2022.   Sexual activity: Yes  Other Topics Concern   Not on file  Social History Narrative   Not on file   Social Determinants of Health   Financial Resource Strain: Not on file  Food Insecurity: Not on file  Transportation Needs: Not on file  Physical Activity: Not on file  Stress: Not on file  Social Connections: Not on file  Intimate Partner Violence: Not on file     Review of Systems   Gen: Denies any fever, chills, fatigue, weight loss, lack of appetite.  CV: Denies chest pain, heart palpitations, peripheral edema, syncope.  Resp: Denies shortness of breath at rest or with exertion. Denies wheezing or cough.  GI: Denies dysphagia or odynophagia. Denies jaundice, hematemesis, fecal incontinence. GU : Denies urinary burning, urinary frequency, urinary hesitancy MS: Denies joint pain, muscle weakness, cramps, or limitation of movement.  Derm: Denies rash, itching, dry skin Psych: Denies depression, anxiety, memory loss, and confusion Heme: Denies bruising, bleeding, and enlarged lymph nodes.   Physical Exam   BP 119/77 (BP Location: Right Arm, Patient Position: Sitting, Cuff Size: Normal)   Pulse 73   Temp 97.9 F (36.6 C) (Oral)   Ht 6\' 1"  (1.854 m)   Wt 178 lb 11.2 oz (81.1 kg)   SpO2 98%   BMI 23.58 kg/m  General:   Alert and oriented. Pleasant and cooperative. Well-nourished and well-developed.  Head:  Normocephalic and atraumatic. Eyes:  Without icterus Ears:  Normal auditory acuity. Lungs:  Clear to auscultation bilaterally.  Heart:  S1, S2 present without murmurs appreciated.  Abdomen:  +BS, soft, non-tender and non-distended. No HSM noted. No guarding or rebound. No masses appreciated.  Rectal:  Deferred  Msk:  Symmetrical without gross deformities. Normal  posture. Extremities:  Without edema. Neurologic:  Alert and  oriented x4;  grossly normal neurologically. Skin:  Intact without significant lesions or rashes. Psych:  Alert and cooperative. Normal mood and affect.  Lab Results  Component Value Date   WBC 4.3 01/26/2023   HGB 14.3 01/26/2023   HCT 42.1 01/26/2023   MCV 86.3 01/26/2023   PLT 220 01/26/2023   Lab Results  Component Value Date   ALT 18 01/26/2023   AST 23 01/26/2023   ALKPHOS 56 01/26/2023  BILITOT 0.3 01/26/2023   Lab Results  Component Value Date   INR 1.0 01/26/2023   INR 1.00 12/23/2013   INR 0.96 03/10/2010      Assessment   NAKHI HITSMAN is a 47 y.o. male presenting today to establish care due to concerns for history of cirrhosis due to chronic Hep C.   Chronic Hep C: genotype 1b and treated by [facility] in [place] in 2019. He achieved SVR. Fibroscan in 2018 with elevated kPa score of 15.4. However, multiple abdominal imaging subsequently on file with normal liver, most recently as of Feb 2024. He has no thrombocytopenia, splenomegaly, and I suspect not dealing with underlying cirrhosis. We will update elastography now. As he does have a history of advanced kPa, would favor at least yearly ultrasounds and serial AFP.      PLAN   Elastography Labs in Sept 2024 including AFP (as he recently had labs in March 2024 and did not want to repeat so early) Follow-up in 6 months Continue drug/ETOH cessation. Applauded on this  Donald Mink, PhD, ANP-BC [facility]   I have reviewed the note and agree with the APP's assessment as described in this progress note  If negative imaging, no need for further follow up for hepatitis C.  Katrinka Blazing, MD Gastroenterology and Hepatology [facility]enter[facility]

## 2023-03-22 ENCOUNTER — Ambulatory Visit (HOSPITAL_COMMUNITY)
Admission: RE | Admit: 2023-03-22 | Discharge: 2023-03-22 | Disposition: A | Discharge status: Home / Self Care | Payer: 59 | Source: Ambulatory Visit | Attending: Gastroenterology | Admitting: Gastroenterology

## 2023-03-22 DIAGNOSIS — Z8619 Personal history of other infectious and parasitic diseases: Secondary | ICD-10-CM

## 2023-05-01 ENCOUNTER — Other Ambulatory Visit: Payer: Self-pay | Admitting: *Deleted

## 2023-05-01 MED ORDER — METOPROLOL SUCCINATE ER 25 MG PO TB24: 25.0000 mg | ORAL_TABLET | Freq: Every day | ORAL | 1 refills | Status: DC

## 2023-05-01 MED ORDER — LISINOPRIL 5 MG PO TABS: 5.0000 mg | ORAL_TABLET | Freq: Every day | ORAL | 1 refills | Status: DC

## 2023-05-15 ENCOUNTER — Ambulatory Visit (INDEPENDENT_AMBULATORY_CARE_PROVIDER_SITE_OTHER): Payer: Medicaid Other | Admitting: Gastroenterology

## 2023-06-18 NOTE — Progress Notes (Signed)
Cardiology Office Note:    Date:  06/19/2023   ID:  Donald George, DOB Oct 18, 1976, MRN [id number]  PCP:  Shelby Dubin, FNP  Cardiologist:  None  Electrophysiologist:  None   Referring MD: Shelby Dubin, FNP   Chief Complaint  Patient presents with   Congestive Heart Failure    History of Present Illness:    Donald George is a 47 y.o. male with a hx of polysubstance abuse including methamphetamine and cocaine, cirrhosis, atrial fibrillation, heart failure who presents for follow-up.  He was referred by Colvin Caroli, NP for evaluation of heart failure, initially seen 11/24/2022.  Previously seen by cardiology in [place], [place], had echocardiogram that was unremarkable.  He was admitted 06/2022 with unintentional overdose with fentanyl and methamphetamine.  Developed A-fib with RVR during admission, likely due to overdose.  Echocardiogram showed EF 35% with global hypokinesis.  He was started on Toprol XL 25 mg daily and lisinopril 5 mg daily.  Echocardiogram 10/23/2022 showed EF 60 to 65%, normal diastolic function, normal RV function, no significant valvular disease.  Since last clinic visit, he reports he is doing okay.  Denies any smoking or substance abuse. Denies any chest pain, dyspnea, lightheadedness, syncope, lower extremity edema, or palpitations.    Past Medical History:  Diagnosis Date   Anxiety    CHF (congestive heart failure) (HCC)    Cirrhosis of liver (HCC) 2018   Depression    Drug addiction (HCC)    since age 30 to opiates and etoh. Was an IV drug user.   Hepatitis C 2003   pt has had treatment 2018   Hypertension    Hypothyroidism    Major depressive disorder     Past Surgical History:  Procedure Laterality Date   COLONOSCOPY WITH PROPOFOL N/A 01/30/2023   Procedure: COLONOSCOPY WITH PROPOFOL;  Surgeon: Dolores Frame, MD;  [facility];  Service: Gastroenterology;  Laterality: N/A;  815am, asa 3, needs UDS at pre-op   Removal of cyst  from throat     as a child (53yrs of age)    Current Medications: Current Meds  Medication Sig   metoprolol succinate (TOPROL-XL) 25 MG 24 hr tablet Take 1 tablet (25 mg total) by mouth daily.   Multiple Vitamins-Minerals (MULTIVITAMIN WITH MINERALS) tablet Take 1 tablet by mouth daily.   [DISCONTINUED] lisinopril (ZESTRIL) 5 MG tablet Take 1 tablet (5 mg total) by mouth daily.     Allergies:   Patient has no known allergies.   Social History   Socioeconomic History   Marital status: Single    Spouse name: Not on file   Number of children: 1   Years of education: Not on file   Highest education level: Not on file  Occupational History   Not on file  Tobacco Use   Smoking status: Former    Current packs/day: 0.00    Average packs/day: 0.5 packs/day for 30.0 years (15.0 ttl pk-yrs)    Types: Cigarettes    Start date: 09/30/1992    Quit date: 09/30/2022    Years since quitting: 0.7   Smokeless tobacco: Never  Vaping Use   Vaping status: Never Used  Substance and Sexual Activity   Alcohol use: Not Currently    Comment: last ETOH use 09-13-2022. previously heavy drinker since age 15   Drug use: Not Currently    Types: Cocaine, Methamphetamines, Opium, Benzodiazepines    Comment: last opiod use 10/2020, last meth use 09/14/2022.  Sexual activity: Yes  Other Topics Concern   Not on file  Social History Narrative   Not on file   Social Determinants of Health   Financial Resource Strain: Not on file  Food Insecurity: Food Insecurity Present (07/11/2022)   [facility]   Hunger Vital Sign    Worried About Running Out of Food in the Last Year: Sometimes true    Ran Out of Food in the Last Year: Sometimes true  Transportation Needs: Unmet Transportation Needs (07/11/2022)   [facility]   PRAPARE - Transportation    Lack of Transportation (Medical): Yes    Lack of Transportation (Non-Medical): Yes  Physical  Activity: Not on file  Stress: Not on file  Social Connections: Unknown (04/02/2022)   [facility]   Social Network    Social Network: Not on file     Family History: The patient's family history includes Cancer in his father; Diabetes in his maternal grandmother. There is no history of Colon cancer or Colon polyps.  ROS:   Please see the history of present illness.     All other systems reviewed and are negative.  EKGs/Labs/Other Studies Reviewed:    The following studies were reviewed today:  EKG:   11/24/22: NSR, rate 75, no ST abnormalities 06/19/23: NSR, rate 97, no ST abnormalities  Recent Labs: 01/26/2023: ALT 18; BUN 14; Creatinine, Ser 0.82; Hemoglobin 14.3; Platelets 220; Potassium 4.1; Sodium 137  Recent Lipid Panel No results found for: "CHOL", "TRIG", "HDL", "CHOLHDL", "VLDL", "LDLCALC", "LDLDIRECT"  Physical Exam:    VS:  BP 122/88 (BP Location: Left Arm, Patient Position: Sitting, Cuff Size: Normal)   Pulse 99   Ht 6\' 1"  (1.854 m)   Wt 179 lb 9.6 oz (81.5 kg)   SpO2 100%   BMI 23.70 kg/m     Wt Readings from Last 3 Encounters:  06/19/23 179 lb 9.6 oz (81.5 kg)  03/08/23 178 lb 11.2 oz (81.1 kg)  01/30/23 175 lb (79.4 kg)     GEN:  Well nourished, well developed in no acute distress HEENT: Normal NECK: No JVD; No carotid bruits LYMPHATICS: No lymphadenopathy CARDIAC: RRR, no murmurs, rubs, gallops RESPIRATORY:  Clear to auscultation without rales, wheezing or rhonchi  ABDOMEN: Soft, non-tender, non-distended MUSCULOSKELETAL:  No edema; No deformity  SKIN: Warm and dry NEUROLOGIC:  Alert and oriented x 3 PSYCHIATRIC:  Normal affect   ASSESSMENT:    1. 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 PAF (paroxysmal atrial fibrillation) (HCC)   3. Lipid screening     PLAN:    Acute combined heart failure: Admitted 06/2022 with unintentional overdose with fentanyl and methamphetamine.   Developed A-fib with RVR during admission, likely due to overdose.  Echocardiogram showed EF 35% with global hypokinesis.  He was started on Toprol XL 25 mg daily and lisinopril 5 mg daily.  Echocardiogram 10/23/2022 showed EF 60 to 65%, normal diastolic function, normal RV function, no significant valvular disease. -Likely due to overdose, subsequently had recovery of systolic function -Has been on Toprol-XL and lisinopril but would like to come off medications.  Will wean off medications and will monitor systolic function.  Recommend stopping lisinopril now, will check BP daily for next 2 weeks and let us know results.  Atrial fibrillation: Occurred during admission following fentanyl/methamphetamine overdose 06/2022.  Likely secondary to substance abuse.  CHA2DS2-VASc score 1 (CHF).  No evidence of  recurrence.  Polysubstance abuse: completed rehab, reports no drug uses since 10/01/2022.  Also stopped smoking cigarettes.  Congratulated patient on quitting and encouraged continued cessation  Cirrhosis: Likely due to HCV versus alcohol abuse  Lipid screening: Check lipid panel  RTC in 6 months   Medication Adjustments/Labs and Tests Ordered: Current medicines are reviewed at length with the patient today.  Concerns regarding medicines are outlined above.  Orders Placed This Encounter  Procedures   Lipid panel   EKG 12-Lead   No orders of the defined types were placed in this encounter.   Patient Instructions  Medication Instructions:  STOP Lisinopril *If you need a refill on your cardiac medications before your next appointment, please call your pharmacy*   Lab Work: Lipid panel- Please return for Blood Work within a week or two. No appointment needed, lab here at the office is open Monday-Friday from 8AM to 4PM and closed daily for lunch from 12:45-1:45.   If you have labs (blood work) drawn today and your tests are completely normal, you will receive your results only by: MyChart  Message (if you have MyChart) OR A paper copy in the mail If you have any lab test that is abnormal or we need to change your treatment, we will call you to review the results.  Follow-Up: At [facility], you and your health needs are our priority.  As part of our continuing mission to provide you with exceptional heart care, we have created designated Provider Care Teams.  These Care Teams include your primary Cardiologist (physician) and Advanced Practice Providers (APPs -  Physician Assistants and Nurse Practitioners) who all work together to provide you with the care you need, when you need it.  We recommend signing up for the patient portal called "MyChart".  Sign up information is provided on this After Visit Summary.  MyChart is used to connect with patients for Virtual Visits (Telemedicine).  Patients are able to view lab/test results, encounter notes, upcoming appointments, etc.  Non-urgent messages can be sent to your provider as well.   To learn more about what you can do with MyChart, go to [email or web address].    Your next appointment:   6 month(s)  Provider:   Dr Bjorn Pippin  Other Instructions Keep a log of your BP- Check it daily for 2 weeks, then send in to [place] by MyChart.     Signed, Little Ishikawa, MD  06/19/2023 5:21 PM    [facility]

## 2023-06-19 ENCOUNTER — Ambulatory Visit: Payer: 59 | Attending: Cardiology | Admitting: Cardiology

## 2023-06-19 VITALS — BP 122/88 | HR 99 | Ht 73.0 in | Wt 179.6 lb

## 2023-06-19 DIAGNOSIS — I5041 Acute combined systolic (congestive) and diastolic (congestive) heart failure: Secondary | ICD-10-CM

## 2023-06-19 DIAGNOSIS — I480 Paroxysmal atrial fibrillation: Secondary | ICD-10-CM

## 2023-06-19 DIAGNOSIS — Z13220 Encounter for screening for lipoid disorders: Secondary | ICD-10-CM | POA: Diagnosis not present

## 2023-06-19 NOTE — Patient Instructions (Signed)
Medication Instructions:  STOP Lisinopril *If you need a refill on your cardiac medications before your next appointment, please call your pharmacy*   Lab Work: Lipid panel- Please return for Blood Work within a week or two. No appointment needed, lab here at the office is open Monday-Friday from 8AM to 4PM and closed daily for lunch from 12:45-1:45.   If you have labs (blood work) drawn today and your tests are completely normal, you will receive your results only by: MyChart Message (if you have MyChart) OR A paper copy in the mail If you have any lab test that is abnormal or we need to change your treatment, we will call you to review the results.  Follow-Up: At [facility] you and your health needs are our priority.  As part of our continuing mission to provide you with exceptional heart care, we have created designated Provider Care Teams.  These Care Teams include your primary Cardiologist (physician) and Advanced Practice Providers (APPs -  Physician Assistants and Nurse Practitioners) who all work together to provide you with the care you need, when you need it.  We recommend signing up for the patient portal called "MyChart".  Sign up information is provided on this After Visit Summary.  MyChart is used to connect with patients for Virtual Visits (Telemedicine).  Patients are able to view lab/test results, encounter notes, upcoming appointments, etc.  Non-urgent messages can be sent to your provider as well.   To learn more about what you can do with MyChart, go to [email or web address].    Your next appointment:   6 month(s)  Provider:   Dr Bjorn Pippin  Other Instructions Keep a log of your BP- Check it daily for 2 weeks, then send in to [place] by MyChart.

## 2023-07-30 ENCOUNTER — Other Ambulatory Visit: Payer: Self-pay

## 2023-07-30 DIAGNOSIS — Z8619 Personal history of other infectious and parasitic diseases: Secondary | ICD-10-CM

## 2023-09-06 ENCOUNTER — Other Ambulatory Visit: Payer: Self-pay | Admitting: *Deleted

## 2023-09-06 ENCOUNTER — Ambulatory Visit (INDEPENDENT_AMBULATORY_CARE_PROVIDER_SITE_OTHER): Payer: 59 | Admitting: Gastroenterology

## 2023-09-06 ENCOUNTER — Encounter: Payer: Self-pay | Admitting: Gastroenterology

## 2023-09-06 VITALS — BP 125/91 | HR 105 | Temp 98.0°F | Ht 73.0 in | Wt 182.0 lb

## 2023-09-06 DIAGNOSIS — Z8619 Personal history of other infectious and parasitic diseases: Secondary | ICD-10-CM

## 2023-09-06 DIAGNOSIS — Z09 Encounter for follow-up examination after completed treatment for conditions other than malignant neoplasm: Secondary | ICD-10-CM | POA: Diagnosis not present

## 2023-09-06 NOTE — Progress Notes (Signed)
Gastroenterology Office Note     Primary Care Physician:  Shelby Dubin, FNP  Primary Gastroenterologist: Dr. Levon Hedger    Chief Complaint   Chief Complaint  Patient presents with   Follow-up    Doing well, no issues     History of Present Illness   Donald George is a 47 y.o. male presenting today with a history of chronic Hep C genotype 1b in 2019, s/p treatment by [facility] in [place] with documented SVR, fibroscan in 2018 with elevated kPa score of 15.4 but multiple additional imaging thereafter with normal liver.   Elastography updated with kPa 3.7, (normal), kPa ration 0.14 (less than 0.3). Recent HFP with Tbili 0.4, AST 26, ALT 42, Alk Phos 92. Hgb 16.9, Hct 50.5, Platelets 352.   Back on Wellbutrin and feels so much better. No alcohol use. Some drug use but not IV or intranasal. Had blood work done when presented to the ED with suicidal ideations. HIV negative then. No Hep C labs. No mental status changes or confusion. No abdominal pain, N/V.   Colonoscopy March 2024: normal colon    Past Medical History:  Diagnosis Date   Anxiety    CHF (congestive heart failure) (HCC)    Cirrhosis of liver (HCC) 2018   Depression    Drug addiction (HCC)    since age 21 to opiates and etoh. Was an IV drug user.   Hepatitis C 2003   pt has had treatment 2018   Hypertension    Hypothyroidism    Major depressive disorder     Past Surgical History:  Procedure Laterality Date   COLONOSCOPY WITH PROPOFOL N/A 01/30/2023   Procedure: COLONOSCOPY WITH PROPOFOL;  Surgeon: Dolores Frame, MD;  [facility];  Service: Gastroenterology;  Laterality: N/A;  815am, asa 3, needs UDS at pre-op   Removal of cyst from throat     as a child (41yrs of age)    Current Outpatient Medications  Medication Sig Dispense Refill   buprenorphine-naloxone (SUBOXONE) 8-2 MG SUBL SL tablet Place 3 tablets under the tongue daily.     divalproex (DEPAKOTE SPRINKLE) 125 MG capsule  Take 500 mg by mouth at bedtime.     escitalopram (LEXAPRO) 10 MG tablet Take 10 mg by mouth daily. (Patient not taking: Reported on 06/19/2023)     metoprolol succinate (TOPROL-XL) 25 MG 24 hr tablet Take 1 tablet (25 mg total) by mouth daily. 90 tablet 1   Multiple Vitamins-Minerals (MULTIVITAMIN WITH MINERALS) tablet Take 1 tablet by mouth daily.     nicotine (NICODERM CQ - DOSED IN MG/24 HOURS) 14 mg/24hr patch Place 14 mg onto the skin daily. (Patient not taking: Reported on 06/19/2023)     nicotine polacrilex (COMMIT) 4 MG lozenge Take 4 mg by mouth as needed for smoking cessation. (Patient not taking: Reported on 06/19/2023)     risperiDONE (RISPERDAL) 2 MG tablet Take 2 mg by mouth 2 (two) times daily. (Patient not taking: Reported on 06/19/2023)     No current facility-administered medications for this visit.    Allergies as of 09/06/2023   (No Known Allergies)    Family History  Problem Relation Age of Onset   Cancer Father        skin cancer   Diabetes Maternal Grandmother    Colon cancer Neg Hx    Colon polyps Neg Hx     Social History   Socioeconomic History   Marital status: Single  Spouse name: Not on file   Number of children: 1   Years of education: Not on file   Highest education level: Not on file  Occupational History   Not on file  Tobacco Use   Smoking status: Former    Current packs/day: 0.00    Average packs/day: 0.5 packs/day for 30.0 years (15.0 ttl pk-yrs)    Types: Cigarettes    Start date: 09/30/1992    Quit date: 09/30/2022    Years since quitting: 0.9   Smokeless tobacco: Never  Vaping Use   Vaping status: Never Used  Substance and Sexual Activity   Alcohol use: Not Currently    Comment: last ETOH use 09-13-2022. previously heavy drinker since age 70   Drug use: Not Currently    Types: Cocaine, Methamphetamines, Opium, Benzodiazepines    Comment: last opiod use 10/2020, last meth use 09/14/2022.   Sexual activity: Yes  Other Topics  Concern   Not on file  Social History Narrative   Not on file   Social Determinants of Health   Financial Resource Strain: Not on file  Food Insecurity: Food Insecurity Present (07/11/2022)   [facility]   Hunger Vital Sign    Worried About Running Out of Food in the Last Year: Sometimes true    Ran Out of Food in the Last Year: Sometimes true  Transportation Needs: Unmet Transportation Needs (07/11/2022)   [facility]   PRAPARE - Transportation    Lack of Transportation (Medical): Yes    Lack of Transportation (Non-Medical): Yes  Physical Activity: Not on file  Stress: Not on file  Social Connections: Unknown (04/02/2022)   [facility]   Social Network    Social Network: Not on file  Intimate Partner Violence: Unknown (02/22/2022)   Received from [facility]   HITS    Physically Hurt: Not on file    Insult or Talk Down To: Not on file    Threaten Physical Harm: Not on file    Scream or Curse: Not on file     Review of Systems   Gen: Denies any fever, chills, fatigue, weight loss, lack of appetite.  CV: Denies chest pain, heart palpitations, peripheral edema, syncope.  Resp: Denies shortness of breath at rest or with exertion. Denies wheezing or cough.  GI: Denies dysphagia or odynophagia. Denies jaundice, hematemesis, fecal incontinence. GU : Denies urinary burning, urinary frequency, urinary hesitancy MS: Denies joint pain, muscle weakness, cramps, or limitation of movement.  Derm: Denies rash, itching, dry skin Psych: Denies depression, anxiety, memory loss, and confusion Heme: Denies bruising, bleeding, and enlarged lymph nodes.   Physical Exam   BP (!) 151/106 (BP Location: Right Arm, Patient Position: Sitting, Cuff Size: Normal)   Pulse (!) 101   Temp 98 F (36.7 C) (Oral)   Ht 6\' 1"  (1.854 m)   Wt 182 lb (82.6 kg)   SpO2 94%   BMI 24.01 kg/m  General:   Alert and oriented.  Pleasant and cooperative. Well-nourished and well-developed.  Head:  Normocephalic and atraumatic. Eyes:  Without icterus Abdomen:  +BS, soft, non-tender and non-distended. No HSM noted. No guarding or rebound. No masses appreciated.  Rectal:  Deferred  Msk:  Symmetrical without gross deformities. Normal posture. Extremities:  Without edema. Neurologic:  Alert and  oriented x4;  grossly normal neurologically. Skin:  Intact without significant lesions or rashes. Psych:  Alert and cooperative. Normal mood and affect.  Assessment   Donald George is a 47 y.o. male presenting today with a history of chronic Hep C genotype 1b in 2019, s/p treatment by [facility] in [place] with documented SVR, fibroscan in 2018 with elevated kPa score of 15.4, returning in follow-up.  Notably, he had multiple documented imagings on file with normal liver. Elastography with [place] with kPa 3.7 and kPa ration 0.14. No thrombocytopenia or stigmata of advanced liver disease. Discussed with [facility] clinic, who recommends serial [place] every 6 months in light of prior advanced fibrosis scores. Although this was in setting of chronic Hep C prior to treatment, cancer risk not eliminated. He would like to pursue [place] every 6 months.   Colonoscopy March 2024: normal colon    PLAN   Routine labs today RUQ [place] 6 month follow-up   Gelene Mink, PhD, ANP-BC [facility] Gastroenterology   I have reviewed the note and agree with the APP's assessment as described in this progress note  Katrinka Blazing, MD Gastroenterology and Hepatology [facility] Gastroenter[facility]

## 2023-09-06 NOTE — Addendum Note (Signed)
Addended by: Elinor Dodge on: 09/06/2023 11:15 AM   Modules accepted: Orders

## 2023-09-06 NOTE — Patient Instructions (Signed)
I have ordered a routine ultrasound (I recommend every 6 months).  I have also ordered blood work!  We will see you in 6 months! I am glad you are doing well!  I enjoyed seeing you again today! I value our relationship and want to provide genuine, compassionate, and quality care. You may receive a survey regarding your visit with me, and I welcome your feedback! Thanks so much for taking the time to complete this. I look forward to seeing you again.      Gelene Mink, PhD, ANP-BC [facility] Gastroenterology

## 2023-09-07 LAB — CBC WITH DIFFERENTIAL/PLATELET
Basophils Absolute: 0 10*3/uL (ref 0.0–0.2)
Basos: 1 %
EOS (ABSOLUTE): 0.2 10*3/uL (ref 0.0–0.4)
Eos: 4 %
Hematocrit: 46.2 % (ref 37.5–51.0)
Hemoglobin: 15.5 g/dL (ref 13.0–17.7)
Immature Grans (Abs): 0 10*3/uL (ref 0.0–0.1)
Immature Granulocytes: 0 %
Lymphocytes Absolute: 1.2 10*3/uL (ref 0.7–3.1)
Lymphs: 23 %
MCH: 29.3 pg (ref 26.6–33.0)
MCHC: 33.5 g/dL (ref 31.5–35.7)
MCV: 87 fL (ref 79–97)
Monocytes Absolute: 0.4 10*3/uL (ref 0.1–0.9)
Monocytes: 8 %
Neutrophils Absolute: 3.4 10*3/uL (ref 1.4–7.0)
Neutrophils: 64 %
Platelets: 284 10*3/uL (ref 150–450)
RBC: 5.29 x10E6/uL (ref 4.14–5.80)
RDW: 12.4 % (ref 11.6–15.4)
WBC: 5.3 10*3/uL (ref 3.4–10.8)

## 2023-09-07 LAB — COMPREHENSIVE METABOLIC PANEL
ALT: 29 [IU]/L (ref 0–44)
AST: 35 [IU]/L (ref 0–40)
Albumin: 5 g/dL (ref 4.1–5.1)
Alkaline Phosphatase: 77 [IU]/L (ref 44–121)
BUN/Creatinine Ratio: 12 (ref 9–20)
BUN: 11 mg/dL (ref 6–24)
Bilirubin Total: <0.2 mg/dL (ref 0.0–1.2)
CO2: 20 mmol/L (ref 20–29)
Calcium: 9.7 mg/dL (ref 8.7–10.2)
Chloride: 99 mmol/L (ref 96–106)
Creatinine, Ser: 0.94 mg/dL (ref 0.76–1.27)
Globulin, Total: 3.1 g/dL (ref 1.5–4.5)
Glucose: 100 mg/dL — ABNORMAL HIGH (ref 70–99)
Potassium: 4.5 mmol/L (ref 3.5–5.2)
Sodium: 144 mmol/L (ref 134–144)
Total Protein: 8.1 g/dL (ref 6.0–8.5)
eGFR: 101 mL/min/{1.73_m2} (ref >59)

## 2023-09-07 LAB — PROTIME-INR
INR: 1.1 (ref 0.9–1.2)
Prothrombin Time: 11.9 s (ref 9.1–12.0)

## 2023-09-07 LAB — HCV RNA QUANT RFLX ULTRA OR GENOTYP: HCV Quant Baseline: NOT DETECTED [IU]/mL

## 2023-09-07 LAB — AFP TUMOR MARKER: AFP, Serum, Tumor Marker: 2.1 ng/mL (ref 0.0–6.9)

## 2023-09-14 ENCOUNTER — Ambulatory Visit (HOSPITAL_COMMUNITY): Payer: 59

## 2023-09-21 ENCOUNTER — Ambulatory Visit (HOSPITAL_COMMUNITY): Payer: 59

## 2023-09-24 ENCOUNTER — Ambulatory Visit (HOSPITAL_COMMUNITY)
Admission: RE | Admit: 2023-09-24 | Discharge: 2023-09-24 | Disposition: A | Discharge status: Home / Self Care | Payer: 59 | Source: Ambulatory Visit | Attending: Gastroenterology | Admitting: Gastroenterology

## 2023-09-24 DIAGNOSIS — Z8619 Personal history of other infectious and parasitic diseases: Secondary | ICD-10-CM | POA: Insufficient documentation

## 2024-02-18 ENCOUNTER — Encounter: Payer: Self-pay | Admitting: Gastroenterology

## 2024-02-28 ENCOUNTER — Encounter: Payer: Self-pay | Admitting: Internal Medicine

## 2024-09-03 ENCOUNTER — Encounter (INDEPENDENT_AMBULATORY_CARE_PROVIDER_SITE_OTHER): Payer: Self-pay | Admitting: Gastroenterology
